# Patient Record
Sex: Male | Born: 1945 | Race: White | Hispanic: No | Marital: Married | Smoking: Never smoker
Health system: Southern US, Community
[De-identification: ages and names within clinical notes are randomized; demographics above are authoritative.]

## PROBLEM LIST (undated history)

## (undated) DIAGNOSIS — E785 Hyperlipidemia, unspecified: Secondary | ICD-10-CM

## (undated) DIAGNOSIS — I2510 Atherosclerotic heart disease of native coronary artery without angina pectoris: Secondary | ICD-10-CM

## (undated) DIAGNOSIS — K219 Gastro-esophageal reflux disease without esophagitis: Secondary | ICD-10-CM

## (undated) DIAGNOSIS — G4730 Sleep apnea, unspecified: Secondary | ICD-10-CM

## (undated) DIAGNOSIS — Z8719 Personal history of other diseases of the digestive system: Secondary | ICD-10-CM

## (undated) DIAGNOSIS — M1990 Unspecified osteoarthritis, unspecified site: Secondary | ICD-10-CM

## (undated) HISTORY — PX: KNEE SURGERY: SHX244

## (undated) HISTORY — DX: Atherosclerotic heart disease of native coronary artery without angina pectoris: I25.10

## (undated) HISTORY — DX: Hyperlipidemia, unspecified: E78.5

## (undated) HISTORY — PX: REFRACTIVE SURGERY: SHX103

## (undated) HISTORY — PX: CARDIAC CATHETERIZATION: SHX172

---

## 1997-10-01 ENCOUNTER — Other Ambulatory Visit: Admission: RE | Admit: 1997-10-01 | Discharge: 1997-10-01 | Payer: Self-pay | Admitting: *Deleted

## 2000-09-12 ENCOUNTER — Encounter: Admission: RE | Admit: 2000-09-12 | Discharge: 2000-09-12 | Payer: Self-pay | Admitting: *Deleted

## 2001-12-22 ENCOUNTER — Ambulatory Visit (HOSPITAL_COMMUNITY): Admission: RE | Admit: 2001-12-22 | Discharge: 2001-12-22 | Payer: Self-pay | Admitting: *Deleted

## 2004-11-05 HISTORY — PX: OTHER SURGICAL HISTORY: SHX169

## 2006-10-24 HISTORY — PX: OTHER SURGICAL HISTORY: SHX169

## 2010-08-13 ENCOUNTER — Other Ambulatory Visit: Payer: Self-pay | Admitting: Gastroenterology

## 2012-01-06 ENCOUNTER — Other Ambulatory Visit: Payer: Self-pay | Admitting: Gastroenterology

## 2012-11-30 ENCOUNTER — Encounter: Payer: Self-pay | Admitting: Physician Assistant

## 2012-11-30 DIAGNOSIS — I2510 Atherosclerotic heart disease of native coronary artery without angina pectoris: Secondary | ICD-10-CM | POA: Insufficient documentation

## 2012-11-30 DIAGNOSIS — E785 Hyperlipidemia, unspecified: Secondary | ICD-10-CM | POA: Insufficient documentation

## 2012-12-07 ENCOUNTER — Encounter: Payer: Self-pay | Admitting: Cardiovascular Disease

## 2012-12-07 ENCOUNTER — Ambulatory Visit (INDEPENDENT_AMBULATORY_CARE_PROVIDER_SITE_OTHER): Payer: Medicare Other | Admitting: Cardiovascular Disease

## 2012-12-07 VITALS — BP 120/62 | HR 53 | Ht 69.0 in | Wt 168.0 lb

## 2012-12-07 DIAGNOSIS — I2510 Atherosclerotic heart disease of native coronary artery without angina pectoris: Secondary | ICD-10-CM

## 2012-12-07 DIAGNOSIS — E785 Hyperlipidemia, unspecified: Secondary | ICD-10-CM

## 2012-12-07 MED ORDER — METOPROLOL TARTRATE 25 MG PO TABS: ORAL_TABLET | ORAL | Status: DC

## 2012-12-07 NOTE — Patient Instructions (Addendum)
Your physician wants you to follow-up in: 1 year with Dr Berry. You will receive a reminder letter in the mail two months in advance. If you don't receive a letter, please call our office to schedule the follow-up appointment.  

## 2012-12-07 NOTE — Assessment & Plan Note (Signed)
On statin therapy followed by his PCP 

## 2012-12-07 NOTE — Progress Notes (Signed)
12/07/2012 Clifford Sheppard   1945-04-27  [id number]  Primary Physician Mickie Hillier, MD Primary Cardiologist: Runell Gess MD Roseanne Reno   HPI:  The patient is a very pleasant 67 year old, thin and fit appearing, married, Caucasian male, father of three, grandfather to three grandchildren, who I saw a year ago. I catheterized him in May 1998 revealing a total RCA of left to right collaterals in the circumflex. The circumflex did have a 75% mid stenosis. I elected to treat him medically at that time. 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 It is completely asymptomatic. His lipid profile followed by his PCP. Since I saw him back in the office one year ago he denies chest pain or shortness of breath.     Current Outpatient Prescriptions  Medication Sig Dispense Refill  . aspirin 325 MG tablet Take 325 mg by mouth daily.      Marland Kitchen atorvastatin (LIPITOR) 80 MG tablet Take 80 mg by mouth daily.      . fish oil-omega-3 fatty acids 1000 MG capsule Take 1 g by mouth daily.      . metoprolol tartrate (LOPRESSOR) 25 MG tablet 1/2 tablet twice a day  90 tablet  3  . Multiple Vitamin (MULTIVITAMIN) capsule Take 1 capsule by mouth daily.       No current facility-administered medications for this visit.    Not on File  History   Social History  . Marital Status: Married    Spouse Name: N/A    Number of Children: N/A  . Years of Education: N/A   Occupational History  . Not on file.   Social History Main Topics  . Smoking status: Never Smoker   . Smokeless tobacco: Not on file  . Alcohol Use: Not on file  . Drug Use: Not on file  . Sexual Activity: Not on file   Other Topics Concern  . Not on file   Social History Narrative  . No narrative on file     Review of Systems: General: negative for chills, fever, night sweats or weight changes.  Cardiovascular: negative for chest pain,  dyspnea on exertion, edema, orthopnea, palpitations, paroxysmal nocturnal dyspnea or shortness of breath Dermatological: negative for rash Respiratory: negative for cough or wheezing Urologic: negative for hematuria Abdominal: negative for nausea, vomiting, diarrhea, bright red blood per rectum, melena, or hematemesis Neurologic: negative for visual changes, syncope, or dizziness All other systems reviewed and are otherwise negative except as noted above.    Blood pressure 120/62, pulse 53, height 5\' 9"  (1.753 m), weight 168 lb (76.204 kg).  General appearance: alert and no distress Neck: no adenopathy, no carotid bruit, no JVD, supple, symmetrical, trachea midline and thyroid not enlarged, symmetric, no tenderness/mass/nodules Lungs: clear to auscultation bilaterally Heart: regular rate and rhythm, S1, S2 normal, no murmur, click, rub or gallop Extremities: extremities normal, atraumatic, no cyanosis or edema  EKG sinus bradycardia at 53 without ST or T wave changes  ASSESSMENT AND PLAN:   Coronary artery disease History of cardiac catheterization May 1998 revealing a total RCA with left to right collaterals B. The circumflex which itself had a 75% mid stenosis. I elected to treat him medically at that time. His last Myoview performed 10/24/06 was notable for mild inferobasal ischemia probably related to collateral insufficiency though he is very active and asymptomatic.  HLD (hyperlipidemia) On statin therapy followed by his PCP  Runell Gess MD FACP,FACC,FAHA, [facility] 12/07/2012 11:23 AM

## 2012-12-07 NOTE — Assessment & Plan Note (Signed)
History of cardiac catheterization May 1998 revealing a total RCA with left to right collaterals B. The circumflex which itself had a 75% mid stenosis. I elected to treat him medically at that time. His last Myoview performed 10/24/06 was notable for mild inferobasal ischemia probably related to collateral insufficiency though he is very active and asymptomatic.

## 2013-04-09 ENCOUNTER — Other Ambulatory Visit: Payer: Self-pay | Admitting: *Deleted

## 2013-04-09 MED ORDER — METOPROLOL TARTRATE 25 MG PO TABS: ORAL_TABLET | ORAL | Status: DC

## 2013-04-16 ENCOUNTER — Telehealth: Payer: Self-pay | Admitting: Cardiovascular Disease

## 2013-04-16 MED ORDER — METOPROLOL TARTRATE 25 MG PO TABS: ORAL_TABLET | ORAL | Status: DC

## 2013-04-16 NOTE — Telephone Encounter (Signed)
Returned call and pt verified x 2.  Pt informed message received and refill will be sent for 6 more mos of refills.  Pt verbalized understanding and agreed w/ plan.  Refill(s) sent to pharmacy.

## 2013-04-16 NOTE — Telephone Encounter (Signed)
Got refill on metoprolol but no refills written.  Just changed over to [employer] and wants to get corrected.  Does not have upcoming appt until September.  Please call

## 2013-06-06 ENCOUNTER — Other Ambulatory Visit: Payer: Self-pay | Admitting: Cardiovascular Disease

## 2013-06-06 NOTE — Telephone Encounter (Signed)
Rx was sent to pharmacy electronically. 

## 2013-06-28 ENCOUNTER — Other Ambulatory Visit: Payer: Self-pay | Admitting: Family Medicine

## 2013-06-28 DIAGNOSIS — R109 Unspecified abdominal pain: Secondary | ICD-10-CM

## 2013-07-06 ENCOUNTER — Ambulatory Visit
Admission: RE | Admit: 2013-07-06 | Discharge: 2013-07-06 | Disposition: A | Discharge status: Home / Self Care | Payer: Commercial Managed Care - HMO | Source: Ambulatory Visit | Attending: Family Medicine | Admitting: Family Medicine

## 2013-07-06 DIAGNOSIS — R109 Unspecified abdominal pain: Secondary | ICD-10-CM

## 2013-07-06 MED ORDER — IOHEXOL 300 MG/ML  SOLN
100.0000 mL | Freq: Once | INTRAMUSCULAR | Status: AC | PRN
  Administered 2013-07-06: 100 mL via INTRAVENOUS

## 2013-07-25 ENCOUNTER — Ambulatory Visit
Admission: RE | Admit: 2013-07-25 | Discharge: 2013-07-25 | Disposition: A | Discharge status: Home / Self Care | Payer: Commercial Managed Care - HMO | Source: Ambulatory Visit | Attending: Family Medicine | Admitting: Family Medicine

## 2013-07-25 ENCOUNTER — Other Ambulatory Visit: Payer: Self-pay | Admitting: Family Medicine

## 2013-07-25 DIAGNOSIS — M545 Low back pain, unspecified: Secondary | ICD-10-CM

## 2013-10-24 ENCOUNTER — Other Ambulatory Visit: Payer: Self-pay | Admitting: Cardiovascular Disease

## 2013-10-24 NOTE — Telephone Encounter (Signed)
Rx refill sent to patient pharmacy   

## 2013-12-11 ENCOUNTER — Encounter: Payer: Self-pay | Admitting: Cardiovascular Disease

## 2013-12-11 ENCOUNTER — Ambulatory Visit (INDEPENDENT_AMBULATORY_CARE_PROVIDER_SITE_OTHER): Payer: Commercial Managed Care - HMO | Admitting: Cardiovascular Disease

## 2013-12-11 VITALS — BP 110/60 | HR 70 | Ht 69.0 in | Wt 168.5 lb

## 2013-12-11 DIAGNOSIS — Z01818 Encounter for other preprocedural examination: Secondary | ICD-10-CM

## 2013-12-11 DIAGNOSIS — E785 Hyperlipidemia, unspecified: Secondary | ICD-10-CM

## 2013-12-11 DIAGNOSIS — I2510 Atherosclerotic heart disease of native coronary artery without angina pectoris: Secondary | ICD-10-CM

## 2013-12-11 NOTE — Patient Instructions (Signed)
  We will see you back in follow up in 1 year with Dr Berry.   Dr Berry has ordered: Lexiscan Myoview- this is a test that looks at the blood flow to your heart muscle.  It takes approximately 2 1/2 hours. Please follow instruction sheet, as given.      

## 2013-12-11 NOTE — Assessment & Plan Note (Signed)
On statin therapy followed by his PCP 

## 2013-12-11 NOTE — Assessment & Plan Note (Signed)
History of CAD status post cardiac catheterization by myself 08/10/96 revealing an occluded dominant RCA, 75% segmental mid circumflex and a percent ramus branch stenosis with normal LV function. His last Myoview performed 10/24/06 was nonischemic. He denies chest pain or shortness of breath. He is going to get a rectal hip replacement in November and it's been 7 years since his last functional study which I'm going to repeat to risk stratify him.

## 2013-12-11 NOTE — Progress Notes (Signed)
12/11/2013 Clifford Sheppard   1945-06-18  [id number]  Primary Physician Gennette Pac, MD Primary Cardiologist: Lorretta Harp MD Renae Gloss   HPI:  The patient is a very pleasant 68 year old, thin and fit appearing, married, Caucasian male, father of three, grandfather to three grandchildren, who I saw a year ago. I catheterized him in May 1998 revealing a total RCA of left to right collaterals in the circumflex. The circumflex did have a 75% mid stenosis. I elected to treat him medically at that time. 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 It is completely asymptomatic. His lipid profile followed by his PCP. Since I saw him back in the office one year ago he denies chest pain or shortness of breath.    Current Outpatient Prescriptions  Medication Sig Dispense Refill  . ALEVE 220 MG tablet Take 1 tablet by mouth as needed.      Marland Kitchen aspirin 325 MG tablet Take 325 mg by mouth daily.      Marland Kitchen atorvastatin (LIPITOR) 80 MG tablet Take 80 mg by mouth daily.      . fish oil-omega-3 fatty acids 1000 MG capsule Take 1 g by mouth daily.      . meloxicam (MOBIC) 15 MG tablet Take 1 tablet by mouth as needed.      . metoprolol tartrate (LOPRESSOR) 25 MG tablet TAKE 1/2 TABLET TWICE DAILY  90 tablet  0  . Multiple Vitamin (MULTIVITAMIN) capsule Take 1 capsule by mouth daily.      Marland Kitchen REDNESS RELIEVER EYE DROPS 0.05 % ophthalmic solution Place 1 drop into both eyes as needed.      . sodium chloride (OCEAN) 0.65 % nasal spray Place 1 spray into the nose as needed.       No current facility-administered medications for this visit.    No Known Allergies  History   Social History  . Marital Status: Married    Spouse Name: N/A    Number of Children: N/A  . Years of Education: N/A   Occupational History  . Not on file.   Social History Main Topics  . Smoking status: Never Smoker   . Smokeless  tobacco: Not on file  . Alcohol Use: Not on file  . Drug Use: Not on file  . Sexual Activity: Not on file   Other Topics Concern  . Not on file   Social History Narrative  . No narrative on file     Review of Systems: General: negative for chills, fever, night sweats or weight changes.  Cardiovascular: negative for chest pain, dyspnea on exertion, edema, orthopnea, palpitations, paroxysmal nocturnal dyspnea or shortness of breath Dermatological: negative for rash Respiratory: negative for cough or wheezing Urologic: negative for hematuria Abdominal: negative for nausea, vomiting, diarrhea, bright red blood per rectum, melena, or hematemesis Neurologic: negative for visual changes, syncope, or dizziness All other systems reviewed and are otherwise negative except as noted above.    Blood pressure 110/60, pulse 70, height 5\' 9"  (1.753 m), weight 168 lb 8 oz (76.431 kg).  General appearance: alert and no distress Neck: no adenopathy, no carotid bruit, no JVD, supple, symmetrical, trachea midline and thyroid not enlarged, symmetric, no tenderness/mass/nodules Lungs: clear to auscultation bilaterally Heart: regular rate and rhythm, S1, S2 normal, no murmur, click, rub or gallop Extremities: extremities normal, atraumatic, no cyanosis or edema  EKG normal sinus rhythm at 70 without ST or T wave  changes  ASSESSMENT AND PLAN:   Coronary artery disease History of CAD status post cardiac catheterization by myself 08/10/96 revealing an occluded dominant RCA, 75% segmental mid circumflex and a percent ramus branch stenosis with normal LV function. His last Myoview performed 10/24/06 was nonischemic. He denies chest pain or shortness of breath. He is going to get a rectal hip replacement in November and it's been 7 years since his last functional study which I'm going to repeat to risk stratify him.  HLD (hyperlipidemia) On statin therapy followed by his PCP      Lorretta Harp MD  [facility], [facility] 12/11/2013 3:47 PM

## 2013-12-17 ENCOUNTER — Telehealth (HOSPITAL_COMMUNITY): Payer: Self-pay | Admitting: *Deleted

## 2013-12-19 ENCOUNTER — Telehealth (HOSPITAL_COMMUNITY): Payer: Self-pay

## 2013-12-19 NOTE — Telephone Encounter (Signed)
Encounter complete. 

## 2013-12-20 ENCOUNTER — Telehealth (HOSPITAL_COMMUNITY): Payer: Self-pay

## 2013-12-20 NOTE — Telephone Encounter (Signed)
Encounter complete. 

## 2013-12-21 ENCOUNTER — Other Ambulatory Visit: Payer: Self-pay | Admitting: Cardiovascular Disease

## 2013-12-21 ENCOUNTER — Ambulatory Visit (HOSPITAL_COMMUNITY)
Admission: RE | Admit: 2013-12-21 | Discharge: 2013-12-21 | Disposition: A | Discharge status: Home / Self Care | Payer: Medicare HMO | Source: Ambulatory Visit | Attending: Cardiology | Admitting: Cardiology

## 2013-12-21 DIAGNOSIS — I2510 Atherosclerotic heart disease of native coronary artery without angina pectoris: Secondary | ICD-10-CM | POA: Diagnosis not present

## 2013-12-21 DIAGNOSIS — Z01818 Encounter for other preprocedural examination: Secondary | ICD-10-CM | POA: Insufficient documentation

## 2013-12-21 DIAGNOSIS — Z01810 Encounter for preprocedural cardiovascular examination: Secondary | ICD-10-CM

## 2013-12-21 MED ORDER — TECHNETIUM TC 99M SESTAMIBI GENERIC - CARDIOLITE
30.0000 | Freq: Once | INTRAVENOUS | Status: AC | PRN
  Administered 2013-12-21: 30 via INTRAVENOUS

## 2013-12-21 MED ORDER — TECHNETIUM TC 99M SESTAMIBI GENERIC - CARDIOLITE
10.0000 | Freq: Once | INTRAVENOUS | Status: AC | PRN
  Administered 2013-12-21: 10 via INTRAVENOUS

## 2013-12-21 NOTE — Procedures (Addendum)
[facility] [address] [place] [address] [place] [address] [phone]  Cardiology Nuclear Med Study  Clifford Sheppard is a 68 y.o. male     MRN : [id number]     DOB: 08/26/1945  Procedure Date: 12/21/2013  Nuclear Med Background Indication for Stress Test:  Surgical Clearance History:  CAD;Last NUC MPI on 10/24/2006-ischemia;EF=74% Cardiac Risk Factors: Lipids  Symptoms:  Pt denies symptoms.   Nuclear Pre-Procedure Caffeine/Decaff Intake:  11:00pm NPO After: 9:00am   IV Site: R Hand  IV 0.9% NS with Angio Cath:  22g  Chest Size (in):  44"  IV Started by: Rolene Course, RN  Height: 5\' 9"  (1.753 m)  Cup Size: n/a  BMI:  Body mass index is 24.8 kg/(m^2). Weight:  168 lb (76.204 kg)   Tech Comments:  n/a    Nuclear Med Study 1 or 2 day study: 1 day  Stress Test Type:  Stress  Order Authorizing Provider:  Quay Burow, MD   Resting Radionuclide: Technetium 62m Sestamibi  Resting Radionuclide Dose: 10.8 mCi   Stress Radionuclide:  Technetium 60m Sestamibi  Stress Radionuclide Dose: 31.0 mCi           Stress Protocol Rest HR:59 Stress HR: 137  Rest BP: 143/79 Stress BP: 210/98  Exercise Time (min): 11:00 METS: 13.30   Predicted Max HR: 152 bpm % Max HR: 90.13 bpm Rate Pressure Product: 28770  Dose of Adenosine (mg):  n/a Dose of Lexiscan: n/a mg  Dose of Atropine (mg): n/a Dose of Dobutamine: n/a mcg/kg/min (at max HR)  Stress Test Technologist: Mellody Memos, CCT Nuclear Technologist: Otho Perl, CNMT   Rest Procedure:  Myocardial perfusion imaging was performed at rest 45 minutes following the intravenous administration of Technetium 74m Sestamibi. Stress Procedure:  The patient performed treadmill exercise using a Bruce  Protocol for 11 minutes. The patient stopped due to generalized fatigue. Patient denied any chest pain.  There were significant ST-T wave changes.  Technetium 24m Sestamibi was injected at peak exercise  and myocardial perfusion imaging was performed after a brief delay.  Transient Ischemic Dilatation (Normal <1.22):  1.06 QGS EDV:  83 ml QGS ESV:  33 ml LV Ejection Fraction: 60%        Rest ECG: NSR - Normal EKG  Stress ECG: No significant change from baseline ECG  QPS Raw Data Images:  Normal; no motion artifact; normal heart/lung ratio. Stress Images:  There is decreased uptake in the inferior wall. Rest Images:  Normal homogeneous uptake in all areas of the myocardium. Subtraction (SDS):  These findings are consistent with ischemia.  Impression Exercise Capacity:  Excellent exercise capacity. BP Response:  Normal blood pressure response. Clinical Symptoms:  No significant symptoms noted. ECG Impression:  No significant ST segment change suggestive of ischemia. Comparison with Prior Nuclear Study: No significant change from previous study  Overall Impression:  Low risk stress nuclear study Inferobasal ischemia vs valve plane artifact unchanged from prior study 2008.  LV Wall Motion:  NL LV Function; NL Wall Motion   Lorretta Harp, MD  12/22/2013 11:40 AM

## 2013-12-24 ENCOUNTER — Encounter: Payer: Self-pay | Admitting: *Deleted

## 2013-12-24 NOTE — Telephone Encounter (Signed)
Rx was sent to pharmacy electronically. 

## 2013-12-27 ENCOUNTER — Telehealth: Payer: Self-pay | Admitting: Cardiovascular Disease

## 2013-12-27 NOTE — Telephone Encounter (Signed)
Please call,would like his stress test results from last Friday(12-21-13).

## 2013-12-27 NOTE — Telephone Encounter (Signed)
Spoke with pt, aware of myoview results. 

## 2014-01-24 ENCOUNTER — Other Ambulatory Visit: Payer: Self-pay | Admitting: Orthopedic Surgery

## 2014-01-28 ENCOUNTER — Encounter (HOSPITAL_COMMUNITY): Payer: Self-pay | Admitting: Pharmacy Technician

## 2014-01-30 ENCOUNTER — Ambulatory Visit (HOSPITAL_COMMUNITY)
Admission: RE | Admit: 2014-01-30 | Discharge: 2014-01-30 | Disposition: A | Discharge status: Home / Self Care | Payer: Medicare HMO | Source: Ambulatory Visit | Attending: Orthopedic Surgery | Admitting: Orthopedic Surgery

## 2014-01-30 ENCOUNTER — Encounter (HOSPITAL_COMMUNITY)
Admission: RE | Admit: 2014-01-30 | Discharge: 2014-01-30 | Disposition: A | Discharge status: Home / Self Care | Payer: Medicare HMO | Source: Ambulatory Visit | Attending: Orthopedic Surgery | Admitting: Orthopedic Surgery

## 2014-01-30 ENCOUNTER — Encounter (HOSPITAL_COMMUNITY): Payer: Self-pay

## 2014-01-30 DIAGNOSIS — Z01818 Encounter for other preprocedural examination: Secondary | ICD-10-CM

## 2014-01-30 HISTORY — DX: Personal history of other diseases of the digestive system: Z87.19

## 2014-01-30 HISTORY — DX: Unspecified osteoarthritis, unspecified site: M19.90

## 2014-01-30 HISTORY — DX: Gastro-esophageal reflux disease without esophagitis: K21.9

## 2014-01-30 LAB — URINALYSIS, ROUTINE W REFLEX MICROSCOPIC
Bilirubin Urine: NEGATIVE
Glucose, UA: NEGATIVE mg/dL
HGB URINE DIPSTICK: NEGATIVE
Ketones, ur: NEGATIVE mg/dL
Leukocytes, UA: NEGATIVE
Nitrite: NEGATIVE
PH: 6.5 (ref 5.0–8.0)
Protein, ur: NEGATIVE mg/dL
SPECIFIC GRAVITY, URINE: 1.019 (ref 1.005–1.030)
Urobilinogen, UA: 1 mg/dL (ref 0.0–1.0)

## 2014-01-30 LAB — COMPREHENSIVE METABOLIC PANEL
ALK PHOS: 77 U/L (ref 39–117)
ALT: 27 U/L (ref 0–53)
ANION GAP: 13 (ref 5–15)
AST: 27 U/L (ref 0–37)
Albumin: 4.1 g/dL (ref 3.5–5.2)
BILIRUBIN TOTAL: 0.6 mg/dL (ref 0.3–1.2)
BUN: 25 mg/dL — ABNORMAL HIGH (ref 6–23)
CO2: 28 meq/L (ref 19–32)
CREATININE: 1.11 mg/dL (ref 0.50–1.35)
Calcium: 9.2 mg/dL (ref 8.4–10.5)
Chloride: 100 mEq/L (ref 96–112)
GFR, EST AFRICAN AMERICAN: 77 mL/min — AB (ref >90)
GFR, EST NON AFRICAN AMERICAN: 66 mL/min — AB (ref >90)
GLUCOSE: 94 mg/dL (ref 70–99)
POTASSIUM: 4 meq/L (ref 3.7–5.3)
Sodium: 141 mEq/L (ref 137–147)
Total Protein: 7.2 g/dL (ref 6.0–8.3)

## 2014-01-30 LAB — ABO/RH: ABO/RH(D): A POS

## 2014-01-30 LAB — CBC WITH DIFFERENTIAL/PLATELET
Basophils Absolute: 0 10*3/uL (ref 0.0–0.1)
Basophils Relative: 0 % (ref 0–1)
Eosinophils Absolute: 0.2 10*3/uL (ref 0.0–0.7)
Eosinophils Relative: 3 % (ref 0–5)
HEMATOCRIT: 45.3 % (ref 39.0–52.0)
Hemoglobin: 16 g/dL (ref 13.0–17.0)
LYMPHS ABS: 1.9 10*3/uL (ref 0.7–4.0)
Lymphocytes Relative: 28 % (ref 12–46)
MCH: 32.5 pg (ref 26.0–34.0)
MCHC: 35.3 g/dL (ref 30.0–36.0)
MCV: 91.9 fL (ref 78.0–100.0)
MONO ABS: 0.4 10*3/uL (ref 0.1–1.0)
MONOS PCT: 6 % (ref 3–12)
NEUTROS ABS: 4.2 10*3/uL (ref 1.7–7.7)
NEUTROS PCT: 63 % (ref 43–77)
Platelets: 182 10*3/uL (ref 150–400)
RBC: 4.93 MIL/uL (ref 4.22–5.81)
RDW: 12.5 % (ref 11.5–15.5)
WBC: 6.7 10*3/uL (ref 4.0–10.5)

## 2014-01-30 LAB — PROTIME-INR
INR: 1.07 (ref 0.00–1.49)
Prothrombin Time: 14.1 seconds (ref 11.6–15.2)

## 2014-01-30 LAB — SURGICAL PCR SCREEN
MRSA, PCR: NEGATIVE
STAPHYLOCOCCUS AUREUS: POSITIVE — AB

## 2014-01-30 LAB — TYPE AND SCREEN
ABO/RH(D): A POS
ANTIBODY SCREEN: NEGATIVE

## 2014-01-30 LAB — APTT: APTT: 29 s (ref 24–37)

## 2014-01-30 NOTE — Pre-Procedure Instructions (Signed)
JARMAN LITTON  01/30/2014   Your procedure is scheduled on:   Friday  02/08/14  Report to [facility] Admitting at 530 AM.  Call this number if you have problems the morning of surgery: [phone]   Remember:   Do not eat food or drink liquids after midnight.   Take these medicines the morning of surgery with A SIP OF WATER:   METOPROLOL(LOPRESSOR), OMEPRAZOLE (PRILOSEC)                   STOP ASPIRIN, MELOXICAM(MOBIC), MULTIVITAMIN, OMEGA3 FATTY ACID (FISH OIL)   Do not wear jewelry, make-up or nail polish.  Do not wear lotions, powders, or perfumes. You may wear deodorant.  Do not shave 48 hours prior to surgery. Men may shave face and neck.  Do not bring valuables to the hospital.  [facility] is not responsible                  for any belongings or valuables.               Contacts, dentures or bridgework may not be worn into surgery.  Leave suitcase in the car. After surgery it may be brought to your room.  For patients admitted to the hospital, discharge time is determined by your                treatment team.               Patients discharged the day of surgery will not be allowed to drive  home.  Name and phone number of your driver:   Special Instructions:  Special Instructions:  - Preparing for Surgery  Before surgery, you can play an important role.  Because skin is not sterile, your skin needs to be as free of germs as possible.  You can reduce the number of germs on you skin by washing with CHG (chlorahexidine gluconate) soap before surgery.  CHG is an antiseptic cleaner which kills germs and bonds with the skin to continue killing germs even after washing.  Please DO NOT use if you have an allergy to CHG or antibacterial soaps.  If your skin becomes reddened/irritated stop using the CHG and inform your nurse when you arrive at Short Stay.  Do not shave (including legs and underarms) for at least 48 hours prior to the first CHG shower.  You may  shave your face.  Please follow these instructions carefully:   1.  Shower with CHG Soap the night before surgery and the morning of Surgery.  2.  If you choose to wash your hair, wash your hair first as usual with your normal shampoo.  3.  After you shampoo, rinse your hair and body thoroughly to remove the Shampoo.  4.  Use CHG as you would any other liquid soap. You can apply chg directly to the skin and wash gently with scrungie or a clean washcloth.  5.  Apply the CHG Soap to your body ONLY FROM THE NECK DOWN.  Do not use on open wounds or open sores.  Avoid contact with your eyes, ears, mouth and genitals (private parts).  Wash genitals (private parts with your normal soap.  6.  Wash thoroughly, paying special attention to the area where your surgery will be performed.  7.  Thoroughly rinse your body with warm water from the neck down.  8.  DO NOT shower/wash with your normal soap after using and rinsing off  the CHG Soap.  9.  Pat yourself dry with a clean towel.            10.  Wear clean pajamas.            11.  Place clean sheets on your bed the night of your first shower and do not sleep with pets.  Day of Surgery  Do not apply any lotions/deodorants the morning of surgery.  Please wear clean clothes to the hospital/surgery center.   Please read over the following fact sheets that you were given: Pain Booklet, Coughing and Deep Breathing, Blood Transfusion Information, MRSA Information and Surgical Site Infection Prevention

## 2014-01-31 NOTE — Progress Notes (Signed)
Mupirocin Ointment Rx called into [facility] on [address] at 7:15 AM today. Notified pt via phone that PCR was positive for staph. Pt voiced understanding.

## 2014-02-07 MED ORDER — CEFAZOLIN SODIUM-DEXTROSE 2-3 GM-% IV SOLR
2.0000 g | INTRAVENOUS | Status: AC
  Administered 2014-02-08: 2 g via INTRAVENOUS
  Filled 2014-02-07: qty 50

## 2014-02-08 ENCOUNTER — Inpatient Hospital Stay (HOSPITAL_COMMUNITY): Payer: Medicare HMO

## 2014-02-08 ENCOUNTER — Inpatient Hospital Stay (HOSPITAL_COMMUNITY): Payer: Medicare HMO | Admitting: Anesthesiology

## 2014-02-08 ENCOUNTER — Encounter (HOSPITAL_COMMUNITY)
Admission: RE | Disposition: A | Discharge status: Home Health | Payer: Self-pay | Source: Ambulatory Visit | Attending: Orthopedic Surgery

## 2014-02-08 ENCOUNTER — Encounter (HOSPITAL_COMMUNITY): Payer: Self-pay | Admitting: *Deleted

## 2014-02-08 ENCOUNTER — Inpatient Hospital Stay (HOSPITAL_COMMUNITY)
Admission: RE | Admit: 2014-02-08 | Discharge: 2014-02-09 | DRG: 470 | Disposition: A | Discharge status: Home Health | Payer: Medicare HMO | Source: Ambulatory Visit | Attending: Orthopedic Surgery | Admitting: Orthopedic Surgery

## 2014-02-08 DIAGNOSIS — K219 Gastro-esophageal reflux disease without esophagitis: Secondary | ICD-10-CM | POA: Diagnosis present

## 2014-02-08 DIAGNOSIS — I10 Essential (primary) hypertension: Secondary | ICD-10-CM | POA: Diagnosis present

## 2014-02-08 DIAGNOSIS — Z79899 Other long term (current) drug therapy: Secondary | ICD-10-CM | POA: Diagnosis not present

## 2014-02-08 DIAGNOSIS — I2510 Atherosclerotic heart disease of native coronary artery without angina pectoris: Secondary | ICD-10-CM | POA: Diagnosis present

## 2014-02-08 DIAGNOSIS — M25551 Pain in right hip: Secondary | ICD-10-CM | POA: Diagnosis present

## 2014-02-08 DIAGNOSIS — M1611 Unilateral primary osteoarthritis, right hip: Principal | ICD-10-CM | POA: Diagnosis present

## 2014-02-08 DIAGNOSIS — E785 Hyperlipidemia, unspecified: Secondary | ICD-10-CM | POA: Diagnosis present

## 2014-02-08 DIAGNOSIS — Z7982 Long term (current) use of aspirin: Secondary | ICD-10-CM | POA: Diagnosis not present

## 2014-02-08 DIAGNOSIS — Z419 Encounter for procedure for purposes other than remedying health state, unspecified: Secondary | ICD-10-CM

## 2014-02-08 HISTORY — PX: TOTAL HIP ARTHROPLASTY: SHX124

## 2014-02-08 SURGERY — ARTHROPLASTY, HIP, TOTAL, ANTERIOR APPROACH
Anesthesia: General | Site: Hip | Laterality: Right

## 2014-02-08 MED ORDER — GLYCOPYRROLATE 0.2 MG/ML IJ SOLN
INTRAMUSCULAR | Status: AC
  Filled 2014-02-08: qty 4

## 2014-02-08 MED ORDER — ONDANSETRON HCL 4 MG/2ML IJ SOLN
INTRAMUSCULAR | Status: DC | PRN
  Administered 2014-02-08: 4 mg via INTRAVENOUS

## 2014-02-08 MED ORDER — NEOSTIGMINE METHYLSULFATE 10 MG/10ML IV SOLN
INTRAVENOUS | Status: DC | PRN
Start: 2014-02-08 — End: 2014-02-08
  Administered 2014-02-08: 4 mg via INTRAVENOUS

## 2014-02-08 MED ORDER — ONDANSETRON HCL 4 MG PO TABS: 4.0000 mg | ORAL_TABLET | Freq: Four times a day (QID) | ORAL | Status: DC | PRN

## 2014-02-08 MED ORDER — DOCUSATE SODIUM 100 MG PO CAPS
100.0000 mg | ORAL_CAPSULE | Freq: Two times a day (BID) | ORAL | Status: DC
  Administered 2014-02-08 – 2014-02-09 (×2): 100 mg via ORAL
  Filled 2014-02-08 (×3): qty 1

## 2014-02-08 MED ORDER — ROCURONIUM BROMIDE 50 MG/5ML IV SOLN
INTRAVENOUS | Status: AC
  Filled 2014-02-08: qty 1

## 2014-02-08 MED ORDER — MIDAZOLAM HCL 5 MG/5ML IJ SOLN
INTRAMUSCULAR | Status: DC | PRN
  Administered 2014-02-08: 2 mg via INTRAVENOUS

## 2014-02-08 MED ORDER — NEOSTIGMINE METHYLSULFATE 10 MG/10ML IV SOLN
INTRAVENOUS | Status: AC
  Filled 2014-02-08: qty 1

## 2014-02-08 MED ORDER — MIDAZOLAM HCL 2 MG/2ML IJ SOLN
INTRAMUSCULAR | Status: AC
  Filled 2014-02-08: qty 2

## 2014-02-08 MED ORDER — BSS IO SOLN
15.0000 mL | Freq: Once | INTRAOCULAR | Status: AC
  Administered 2014-02-08: 15 mL
  Filled 2014-02-08: qty 15

## 2014-02-08 MED ORDER — FENTANYL CITRATE 0.05 MG/ML IJ SOLN
INTRAMUSCULAR | Status: AC
  Filled 2014-02-08: qty 5

## 2014-02-08 MED ORDER — ASPIRIN EC 325 MG PO TBEC: 325.0000 mg | DELAYED_RELEASE_TABLET | Freq: Two times a day (BID) | ORAL | Status: DC

## 2014-02-08 MED ORDER — METHOCARBAMOL 500 MG PO TABS
500.0000 mg | ORAL_TABLET | Freq: Four times a day (QID) | ORAL | Status: DC | PRN
  Administered 2014-02-08: 500 mg via ORAL
  Filled 2014-02-08 (×2): qty 1

## 2014-02-08 MED ORDER — ALBUMIN HUMAN 5 % IV SOLN
INTRAVENOUS | Status: DC | PRN
  Administered 2014-02-08: 09:00:00 via INTRAVENOUS

## 2014-02-08 MED ORDER — HYDROMORPHONE HCL 1 MG/ML IJ SOLN: 1.0000 mg | INTRAMUSCULAR | Status: DC | PRN

## 2014-02-08 MED ORDER — GLYCOPYRROLATE 0.2 MG/ML IJ SOLN
INTRAMUSCULAR | Status: DC | PRN
  Administered 2014-02-08: .7 mg via INTRAVENOUS

## 2014-02-08 MED ORDER — TRANEXAMIC ACID 100 MG/ML IV SOLN
2000.0000 mg | Freq: Once | INTRAVENOUS | Status: AC
  Administered 2014-02-08: 2000 mg via TOPICAL
  Filled 2014-02-08: qty 20

## 2014-02-08 MED ORDER — METHOCARBAMOL 750 MG PO TABS: 750.0000 mg | ORAL_TABLET | Freq: Three times a day (TID) | ORAL | Status: DC | PRN

## 2014-02-08 MED ORDER — PROPOFOL 10 MG/ML IV BOLUS
INTRAVENOUS | Status: DC | PRN
  Administered 2014-02-08: 100 mg via INTRAVENOUS

## 2014-02-08 MED ORDER — BUPIVACAINE HCL 0.5 % IJ SOLN
INTRAMUSCULAR | Status: DC | PRN
  Administered 2014-02-08: 20 mL

## 2014-02-08 MED ORDER — BUPIVACAINE LIPOSOME 1.3 % IJ SUSP
INTRAMUSCULAR | Status: DC | PRN
  Administered 2014-02-08: 20 mL

## 2014-02-08 MED ORDER — INFLUENZA VAC SPLIT QUAD 0.5 ML IM SUSY
0.5000 mL | PREFILLED_SYRINGE | INTRAMUSCULAR | Status: DC
  Filled 2014-02-08: qty 0.5

## 2014-02-08 MED ORDER — 0.9 % SODIUM CHLORIDE (POUR BTL) OPTIME
TOPICAL | Status: DC | PRN
  Administered 2014-02-08: 1000 mL

## 2014-02-08 MED ORDER — ARTIFICIAL TEARS OP OINT
TOPICAL_OINTMENT | OPHTHALMIC | Status: AC
  Filled 2014-02-08: qty 3.5

## 2014-02-08 MED ORDER — CEFAZOLIN SODIUM-DEXTROSE 2-3 GM-% IV SOLR
2.0000 g | Freq: Four times a day (QID) | INTRAVENOUS | Status: AC
  Administered 2014-02-08 (×2): 2 g via INTRAVENOUS
  Filled 2014-02-08 (×2): qty 50

## 2014-02-08 MED ORDER — KETOROLAC TROMETHAMINE 15 MG/ML IJ SOLN
15.0000 mg | Freq: Four times a day (QID) | INTRAMUSCULAR | Status: AC
  Administered 2014-02-08 – 2014-02-09 (×4): 15 mg via INTRAVENOUS
  Filled 2014-02-08 (×3): qty 1

## 2014-02-08 MED ORDER — KETOROLAC TROMETHAMINE 0.5 % OP SOLN
1.0000 [drp] | Freq: Three times a day (TID) | OPHTHALMIC | Status: DC | PRN
  Administered 2014-02-08 – 2014-02-09 (×3): 1 [drp] via OPHTHALMIC
  Filled 2014-02-08 (×2): qty 3

## 2014-02-08 MED ORDER — PANTOPRAZOLE SODIUM 40 MG PO TBEC
40.0000 mg | DELAYED_RELEASE_TABLET | Freq: Every day | ORAL | Status: DC
Start: 2014-02-08 — End: 2014-02-09
  Administered 2014-02-08 – 2014-02-09 (×2): 40 mg via ORAL
  Filled 2014-02-08 (×2): qty 1

## 2014-02-08 MED ORDER — EPHEDRINE SULFATE 50 MG/ML IJ SOLN
INTRAMUSCULAR | Status: AC
  Filled 2014-02-08: qty 1

## 2014-02-08 MED ORDER — ASPIRIN EC 325 MG PO TBEC
325.0000 mg | DELAYED_RELEASE_TABLET | Freq: Two times a day (BID) | ORAL | Status: DC
  Administered 2014-02-08 – 2014-02-09 (×2): 325 mg via ORAL
  Filled 2014-02-08 (×4): qty 1

## 2014-02-08 MED ORDER — VECURONIUM BROMIDE 10 MG IV SOLR
INTRAVENOUS | Status: DC | PRN
  Administered 2014-02-08: 2 mg via INTRAVENOUS
  Administered 2014-02-08: 1 mg via INTRAVENOUS

## 2014-02-08 MED ORDER — ROCURONIUM BROMIDE 100 MG/10ML IV SOLN
INTRAVENOUS | Status: DC | PRN
  Administered 2014-02-08: 50 mg via INTRAVENOUS

## 2014-02-08 MED ORDER — EPHEDRINE SULFATE 50 MG/ML IJ SOLN
INTRAMUSCULAR | Status: DC | PRN
  Administered 2014-02-08: 10 mg via INTRAVENOUS

## 2014-02-08 MED ORDER — PROPOFOL 10 MG/ML IV BOLUS
INTRAVENOUS | Status: AC
  Filled 2014-02-08: qty 20

## 2014-02-08 MED ORDER — SODIUM CHLORIDE 0.9 % IV SOLN
INTRAVENOUS | Status: DC
  Administered 2014-02-09: via INTRAVENOUS

## 2014-02-08 MED ORDER — PHENYLEPHRINE 40 MCG/ML (10ML) SYRINGE FOR IV PUSH (FOR BLOOD PRESSURE SUPPORT)
PREFILLED_SYRINGE | INTRAVENOUS | Status: AC
  Filled 2014-02-08: qty 10

## 2014-02-08 MED ORDER — BUPIVACAINE LIPOSOME 1.3 % IJ SUSP
20.0000 mL | INTRAMUSCULAR | Status: DC
  Filled 2014-02-08: qty 20

## 2014-02-08 MED ORDER — SODIUM CHLORIDE 0.9 % IJ SOLN
INTRAMUSCULAR | Status: AC
  Filled 2014-02-08: qty 10

## 2014-02-08 MED ORDER — DEXAMETHASONE SODIUM PHOSPHATE 10 MG/ML IJ SOLN
INTRAMUSCULAR | Status: DC | PRN
  Administered 2014-02-08: 10 mg via INTRAVENOUS

## 2014-02-08 MED ORDER — METOPROLOL TARTRATE 12.5 MG HALF TABLET
12.5000 mg | ORAL_TABLET | Freq: Two times a day (BID) | ORAL | Status: DC
Start: 2014-02-08 — End: 2014-02-09
  Administered 2014-02-08 – 2014-02-09 (×3): 12.5 mg via ORAL
  Filled 2014-02-08 (×4): qty 1

## 2014-02-08 MED ORDER — HYDROMORPHONE HCL 1 MG/ML IJ SOLN
INTRAMUSCULAR | Status: AC
  Filled 2014-02-08: qty 1

## 2014-02-08 MED ORDER — LACTATED RINGERS IV SOLN
INTRAVENOUS | Status: DC | PRN
  Administered 2014-02-08 (×3): via INTRAVENOUS

## 2014-02-08 MED ORDER — FENTANYL CITRATE 0.05 MG/ML IJ SOLN
INTRAMUSCULAR | Status: DC | PRN
  Administered 2014-02-08 (×3): 50 ug via INTRAVENOUS
  Administered 2014-02-08: 100 ug via INTRAVENOUS
  Administered 2014-02-08: 50 ug via INTRAVENOUS

## 2014-02-08 MED ORDER — ALUM & MAG HYDROXIDE-SIMETH 200-200-20 MG/5ML PO SUSP: 30.0000 mL | ORAL | Status: DC | PRN

## 2014-02-08 MED ORDER — ACETAMINOPHEN 650 MG RE SUPP: 650.0000 mg | Freq: Four times a day (QID) | RECTAL | Status: DC | PRN

## 2014-02-08 MED ORDER — OXYCODONE-ACETAMINOPHEN 5-325 MG PO TABS: 1.0000 | ORAL_TABLET | ORAL | Status: DC | PRN

## 2014-02-08 MED ORDER — CHLORHEXIDINE GLUCONATE 4 % EX LIQD
60.0000 mL | Freq: Once | CUTANEOUS | Status: DC
  Filled 2014-02-08: qty 60

## 2014-02-08 MED ORDER — LIDOCAINE HCL (CARDIAC) 20 MG/ML IV SOLN
INTRAVENOUS | Status: AC
  Filled 2014-02-08: qty 5

## 2014-02-08 MED ORDER — LIDOCAINE HCL (CARDIAC) 20 MG/ML IV SOLN
INTRAVENOUS | Status: DC | PRN
  Administered 2014-02-08: 100 mg via INTRAVENOUS

## 2014-02-08 MED ORDER — ACETAMINOPHEN 325 MG PO TABS: 650.0000 mg | ORAL_TABLET | Freq: Four times a day (QID) | ORAL | Status: DC | PRN

## 2014-02-08 MED ORDER — ONDANSETRON HCL 4 MG/2ML IJ SOLN
INTRAMUSCULAR | Status: AC
  Filled 2014-02-08: qty 2

## 2014-02-08 MED ORDER — ATORVASTATIN CALCIUM 80 MG PO TABS
80.0000 mg | ORAL_TABLET | Freq: Every day | ORAL | Status: DC
  Administered 2014-02-08 – 2014-02-09 (×2): 80 mg via ORAL
  Filled 2014-02-08 (×2): qty 1

## 2014-02-08 MED ORDER — SUCCINYLCHOLINE CHLORIDE 20 MG/ML IJ SOLN
INTRAMUSCULAR | Status: AC
  Filled 2014-02-08: qty 1

## 2014-02-08 MED ORDER — ONDANSETRON HCL 4 MG/2ML IJ SOLN: 4.0000 mg | Freq: Four times a day (QID) | INTRAMUSCULAR | Status: DC | PRN

## 2014-02-08 MED ORDER — HYDROMORPHONE HCL 1 MG/ML IJ SOLN
0.2500 mg | INTRAMUSCULAR | Status: DC | PRN
  Administered 2014-02-08 (×4): 0.5 mg via INTRAVENOUS

## 2014-02-08 MED ORDER — PROMETHAZINE HCL 25 MG/ML IJ SOLN: 12.5000 mg | Freq: Four times a day (QID) | INTRAMUSCULAR | Status: DC | PRN

## 2014-02-08 MED ORDER — DEXTROSE 5 % IV SOLN
500.0000 mg | Freq: Four times a day (QID) | INTRAVENOUS | Status: DC | PRN
  Filled 2014-02-08: qty 5

## 2014-02-08 MED ORDER — POLYETHYLENE GLYCOL 3350 17 G PO PACK
17.0000 g | PACK | Freq: Every day | ORAL | Status: DC | PRN
Start: 2014-02-08 — End: 2014-02-09

## 2014-02-08 MED ORDER — DEXAMETHASONE SODIUM PHOSPHATE 10 MG/ML IJ SOLN
INTRAMUSCULAR | Status: AC
  Filled 2014-02-08: qty 1

## 2014-02-08 MED ORDER — BISACODYL 5 MG PO TBEC: 5.0000 mg | DELAYED_RELEASE_TABLET | Freq: Every day | ORAL | Status: DC | PRN

## 2014-02-08 MED ORDER — DIPHENHYDRAMINE HCL 12.5 MG/5ML PO ELIX: 12.5000 mg | ORAL_SOLUTION | ORAL | Status: DC | PRN

## 2014-02-08 MED ORDER — METHOCARBAMOL 500 MG PO TABS
ORAL_TABLET | ORAL | Status: AC
  Filled 2014-02-08: qty 1

## 2014-02-08 SURGICAL SUPPLY — 60 items
BENZOIN TINCTURE PRP APPL 2/3 (GAUZE/BANDAGES/DRESSINGS) ×6 IMPLANT
BLADE SAW SGTL 18X1.27X75 (BLADE) ×2 IMPLANT
BLADE SAW SGTL 18X1.27X75MM (BLADE) ×1
BLADE SURG ROTATE 9660 (MISCELLANEOUS) ×3 IMPLANT
BNDG COHESIVE 6X5 TAN STRL LF (GAUZE/BANDAGES/DRESSINGS) IMPLANT
BNDG GAUZE ELAST 4 BULKY (GAUZE/BANDAGES/DRESSINGS) IMPLANT
CAPT HIP PF MOP ×3 IMPLANT
CELLS DAT CNTRL 66122 CELL SVR (MISCELLANEOUS) IMPLANT
CLOSURE STERI-STRIP 1/2X4 (GAUZE/BANDAGES/DRESSINGS)
CLSR STERI-STRIP ANTIMIC 1/2X4 (GAUZE/BANDAGES/DRESSINGS) IMPLANT
COVER SURGICAL LIGHT HANDLE (MISCELLANEOUS) ×3 IMPLANT
DRAPE C-ARM 42X72 X-RAY (DRAPES) ×3 IMPLANT
DRAPE IMP U-DRAPE 54X76 (DRAPES) ×3 IMPLANT
DRAPE INCISE IOBAN 66X45 STRL (DRAPES) ×3 IMPLANT
DRAPE STERI IOBAN 125X83 (DRAPES) ×3 IMPLANT
DRAPE U-SHAPE 47X51 STRL (DRAPES) ×9 IMPLANT
DRSG MEPILEX BORDER 4X4 (GAUZE/BANDAGES/DRESSINGS) ×3 IMPLANT
DRSG MEPILEX BORDER 4X8 (GAUZE/BANDAGES/DRESSINGS) IMPLANT
DURAPREP 26ML APPLICATOR (WOUND CARE) ×3 IMPLANT
ELECT BLADE 4.0 EZ CLEAN MEGAD (MISCELLANEOUS) ×3
ELECT CAUTERY BLADE 6.4 (BLADE) ×3 IMPLANT
ELECT REM PT RETURN 9FT ADLT (ELECTROSURGICAL) ×3
ELECTRODE BLDE 4.0 EZ CLN MEGD (MISCELLANEOUS) ×1 IMPLANT
ELECTRODE REM PT RTRN 9FT ADLT (ELECTROSURGICAL) ×1 IMPLANT
GAUZE XEROFORM 1X8 LF (GAUZE/BANDAGES/DRESSINGS) IMPLANT
GLOVE BIOGEL PI IND STRL 6.5 (GLOVE) ×1 IMPLANT
GLOVE BIOGEL PI IND STRL 7.0 (GLOVE) ×1 IMPLANT
GLOVE BIOGEL PI IND STRL 8 (GLOVE) ×2 IMPLANT
GLOVE BIOGEL PI INDICATOR 6.5 (GLOVE) ×2
GLOVE BIOGEL PI INDICATOR 7.0 (GLOVE) ×2
GLOVE BIOGEL PI INDICATOR 8 (GLOVE) ×4
GLOVE ECLIPSE 7.0 STRL STRAW (GLOVE) ×3 IMPLANT
GLOVE ECLIPSE 7.5 STRL STRAW (GLOVE) ×9 IMPLANT
GOWN STRL REUS W/ TWL LRG LVL3 (GOWN DISPOSABLE) ×2 IMPLANT
GOWN STRL REUS W/ TWL XL LVL3 (GOWN DISPOSABLE) ×2 IMPLANT
GOWN STRL REUS W/TWL LRG LVL3 (GOWN DISPOSABLE) ×4
GOWN STRL REUS W/TWL XL LVL3 (GOWN DISPOSABLE) ×4
HOOD PEEL AWAY FACE SHEILD DIS (HOOD) ×9 IMPLANT
KIT BASIN OR (CUSTOM PROCEDURE TRAY) ×3 IMPLANT
KIT ROOM TURNOVER OR (KITS) ×3 IMPLANT
MANIFOLD NEPTUNE II (INSTRUMENTS) ×3 IMPLANT
NS IRRIG 1000ML POUR BTL (IV SOLUTION) ×3 IMPLANT
PACK TOTAL JOINT (CUSTOM PROCEDURE TRAY) ×3 IMPLANT
PACK UNIVERSAL I (CUSTOM PROCEDURE TRAY) ×3 IMPLANT
PAD ARMBOARD 7.5X6 YLW CONV (MISCELLANEOUS) ×6 IMPLANT
PIN SECT CUP 56MM (Hips) ×3 IMPLANT
RETRACTOR WOUND ALEXIS SMALL ×3 IMPLANT
RTRCTR WOUND ALEXIS 18CM MED (MISCELLANEOUS)
SPONGE LAP 18X18 X RAY DECT (DISPOSABLE) IMPLANT
STAPLER VISISTAT 35W (STAPLE) IMPLANT
SUT ETHIBOND NAB CT1 #1 30IN (SUTURE) ×6 IMPLANT
SUT MNCRL AB 3-0 PS2 18 (SUTURE) IMPLANT
SUT VIC AB 0 CT1 27 (SUTURE) ×2
SUT VIC AB 0 CT1 27XBRD ANBCTR (SUTURE) ×1 IMPLANT
SUT VIC AB 1 CT1 27 (SUTURE) ×8
SUT VIC AB 1 CT1 27XBRD ANBCTR (SUTURE) ×4 IMPLANT
SUT VIC AB 2-0 CT1 27 (SUTURE) ×2
SUT VIC AB 2-0 CT1 TAPERPNT 27 (SUTURE) ×1 IMPLANT
TOWEL OR 17X24 6PK STRL BLUE (TOWEL DISPOSABLE) ×3 IMPLANT
TOWEL OR 17X26 10 PK STRL BLUE (TOWEL DISPOSABLE) ×3 IMPLANT

## 2014-02-08 NOTE — Evaluation (Signed)
Physical Therapy Evaluation Patient Details Name: Clifford Sheppard MRN: [id number] DOB: 19-Aug-1945 Today's Date: 02/08/2014   History of Present Illness  Clifford Sheppard is a 68 y.o. Male s/p Right THA direct anterior approach on 02/08/14. PMH of CAD, HLD, CAD, GERD, and arthritis.  Clinical Impression  Pt is s/p Rt THA resulting in the deficits listed below (see PT Problem List).  Pt will benefit from skilled PT to increase their independence and safety with mobility to allow discharge to the venue listed below. Anticipate D/C home tomorrow after address stair goal. May progress and not need RW will hold on DME recommendations at this time.      Follow Up Recommendations Outpatient PT;Supervision - Intermittent    Equipment Recommendations  Other (comment) (TBD; may not need RW )    Recommendations for Other Services       Precautions / Restrictions Precautions Precautions: Anterior Hip Precaution Comments: no precautions Restrictions Weight Bearing Restrictions: No RLE Weight Bearing: Weight bearing as tolerated      Mobility  Bed Mobility Overal bed mobility: Needs Assistance Bed Mobility: Supine to Sit     Supine to sit: Supervision;HOB elevated     General bed mobility comments: pt relying on handrails; cues for sequencing  Transfers Overall transfer level: Needs assistance Equipment used: Rolling walker (2 wheeled) Transfers: Sit to/from Stand Sit to Stand: Supervision         General transfer comment: cues for hand placement and safety with RW  Ambulation/Gait Ambulation/Gait assistance: Min guard Ambulation Distance (Feet): 175 Feet Assistive device: Rolling walker (2 wheeled) Gait Pattern/deviations: Step-to pattern;Decreased stride length;Shuffle Gait velocity: decreased Gait velocity interpretation: Below normal speed for age/gender General Gait Details: cues for step through gt pattern and equal strides; min guard to steady primarily with directional  changes and with RW  Stairs            Wheelchair Mobility    Modified Rankin (Stroke Patients Only)       Balance Overall balance assessment: Needs assistance Sitting-balance support: Feet supported;No upper extremity supported Sitting balance-Leahy Scale: Good Sitting balance - Comments: denied dizziness sat EOB ~3 min   Standing balance support: During functional activity;No upper extremity supported Standing balance-Leahy Scale: Fair Standing balance comment: stood brief periods without UE support                             Pertinent Vitals/Pain Pain Assessment: 0-10 Pain Score: 5  Pain Location: Rt hip  Pain Descriptors / Indicators: Sore ("stiff") Pain Intervention(s): Monitored during session;Premedicated before session;Repositioned;Ice applied    Home Living Family/patient expects to be discharged to:: Private residence Living Arrangements: Spouse/significant other Available Help at Discharge: Family;Available 24 hours/day Type of Home: House Home Access: Stairs to enter Entrance Stairs-Rails: Can reach both;Left;Right Entrance Stairs-Number of Steps: 2 Home Layout: One level Home Equipment: Shower seat - built in;Grab bars - tub/shower      Prior Function Level of Independence: Independent               Hand Dominance        Extremity/Trunk Assessment   Upper Extremity Assessment: Defer to OT evaluation           Lower Extremity Assessment: RLE deficits/detail RLE Deficits / Details: quad 3+/5; hip 3/5    Cervical / Trunk Assessment: Normal  Communication   Communication: No difficulties  Cognition Arousal/Alertness: Awake/alert Behavior During Therapy: WFL for tasks  assessed/performed Overall Cognitive Status: Within Functional Limits for tasks assessed                      General Comments      Exercises Total Joint Exercises Ankle Circles/Pumps: AROM;Both;10 reps;Seated Long Arc Quad: AROM;Both;10  reps;Seated      Assessment/Plan    PT Assessment Patient needs continued PT services  PT Diagnosis Difficulty walking;Generalized weakness;Acute pain   PT Problem List Decreased strength;Decreased range of motion;Decreased balance;Decreased activity tolerance;Decreased mobility;Decreased knowledge of use of DME;Pain  PT Treatment Interventions DME instruction;Gait training;Stair training;Functional mobility training;Therapeutic activities;Therapeutic exercise;Balance training;Neuromuscular re-education;Patient/family education   PT Goals (Current goals can be found in the Care Plan section) Acute Rehab PT Goals Patient Stated Goal: to walk as much and exercise as much as possible  PT Goal Formulation: With patient Time For Goal Achievement: 02/10/14 Potential to Achieve Goals: Good    Frequency 7X/week   Barriers to discharge        Co-evaluation               End of Session Equipment Utilized During Treatment: Gait belt Activity Tolerance: Patient tolerated treatment well Patient left: in chair;with call bell/phone within reach Nurse Communication: Mobility status         Time: 2831-5176 PT Time Calculation (min): 17 min   Charges:   PT Evaluation $Initial PT Evaluation Tier I: 1 Procedure PT Treatments $Gait Training: 8-22 mins   PT G CodesGustavus Sheppard, [place] 02/08/2014, 4:14 PM

## 2014-02-08 NOTE — H&P (Signed)
TOTAL HIP ADMISSION H&P  Patient is admitted for right total hip arthroplasty.  Subjective:  Chief Complaint: right hip pain  HPI: Clifford Sheppard, 68 y.o. male, has a history of pain and functional disability in the right hip(s) due to arthritis and patient has failed non-surgical conservative treatments for greater than 12 weeks to include NSAID's and/or analgesics, flexibility and strengthening excercises, supervised PT with diminished ADL's post treatment, weight reduction as appropriate and activity modification.  Onset of symptoms was gradual starting 3 years ago with gradually worsening course since that time.The patient noted no past surgery on the right hip(s).  Patient currently rates pain in the right hip at 9 out of 10 with activity. Patient has night pain, worsening of pain with activity and weight bearing, trendelenberg gait, pain that interfers with activities of daily living, pain with passive range of motion, crepitus and joint swelling. Patient has evidence of subchondral sclerosis, periarticular osteophytes, joint subluxation and joint space narrowing by imaging studies. This condition presents safety issues increasing the risk of falls. This patient has had failure of all reasonable conservative care.  There is no current active infection.  Patient Active Problem List   Diagnosis Date Noted  . Coronary artery disease 11/30/2012  . HLD (hyperlipidemia) 11/30/2012   Past Medical History  Diagnosis Date  . CAD (coronary artery disease)   . HLD (hyperlipidemia)   . GERD (gastroesophageal reflux disease)   . H/O hiatal hernia   . Arthritis     Past Surgical History  Procedure Laterality Date  . 2-d echocardiogram  11/05/2004    ejection fraction greater than 55% normal wall motion. Left atrium mildly dilated. Aortic valve mildly sclerotic.  . Cardiac stress test  10/24/2006    post-rest ejection fraction 74% no significant wall motion abnormalities noted. Was evidence of mild  ischemia in the basal inferior and mid inferior region.  . Cardiac catheterization      10-12 YRS AGO   . Knee surgery      LEFT   ACL   1980S  . Refractive surgery      BILAT     Prescriptions prior to admission  Medication Sig Dispense Refill Last Dose  . aspirin 325 MG tablet Take 325 mg by mouth daily.   Past Week at Unknown time  . atorvastatin (LIPITOR) 80 MG tablet Take 80 mg by mouth daily.   02/07/2014 at Unknown time  . meloxicam (MOBIC) 15 MG tablet Take 15 mg by mouth daily as needed for pain.    Past Week at Unknown time  . metoprolol tartrate (LOPRESSOR) 25 MG tablet Take 0.5 tablets (12.5 mg total) by mouth 2 (two) times daily. 90 tablet 3 02/08/2014 at 0500  . Multiple Vitamin (MULTIVITAMIN) capsule Take 1 capsule by mouth daily.   Past Week at Unknown time  . Omega-3 Fatty Acids (FISH OIL PO) Take 1,400 mg by mouth daily.   Past Week at Unknown time  . omeprazole (PRILOSEC) 20 MG capsule Take 20 mg by mouth daily as needed (for acid reflux).   02/08/2014 at 0500  . REDNESS RELIEVER EYE DROPS 0.05 % ophthalmic solution Place 2 drops into both eyes as needed (for dry or red eyes).    Past Week at Unknown time  . sodium chloride (OCEAN) 0.65 % nasal spray Place 2-3 sprays into both nostrils as needed for congestion.    Past Week at Unknown time   No Known Allergies  History  Substance Use Topics  .  Smoking status: Never Smoker   . Smokeless tobacco: Not on file  . Alcohol Use: Yes     Comment: OCC     History reviewed. No pertinent family history.   ROS ROS: I have reviewed the patient's review of systems thoroughly and there are no positive responses as relates to the HPI. Objective:  Physical Exam  Vital signs in last 24 hours: Temp:  [98.1 F (36.7 C)] 98.1 F (36.7 C) (11/06 0557) Pulse Rate:  [64] 64 (11/06 0557) Resp:  [20] 20 (11/06 0557) BP: (150)/(81) 150/81 mmHg (11/06 0557) SpO2:  [100 %] 100 % (11/06 0557) Weight:  [170 lb (77.111 kg)] 170 lb (77.111  kg) (11/06 0557) Well-developed well-nourished patient in no acute distress. Alert and oriented x3 HEENT:within normal limits Cardiac: Regular rate and rhythm Pulmonary: Lungs clear to auscultation Abdomen: Soft and nontender.  Normal active bowel sounds  Musculoskeletal: (right hip: Painful range of motion.  Limited internal rotation.  Neurovascularly intact distally. Labs:   Estimated body mass index is 25.09 kg/(m^2) as calculated from the following:   Height as of this encounter: 5\' 9"  (1.753 m).   Weight as of this encounter: 170 lb (77.111 kg).   Imaging Review Plain radiographs demonstrate severe degenerative joint disease of the right hip(s). The bone quality appears to be good for age and reported activity level.  Assessment/Plan:  End stage arthritis, right hip(s)  The patient history, physical examination, clinical judgement of the provider and imaging studies are consistent with end stage degenerative joint disease of the right hip(s) and total hip arthroplasty is deemed medically necessary. The treatment options including medical management, injection therapy, arthroscopy and arthroplasty were discussed at length. The risks and benefits of total hip arthroplasty were presented and reviewed. The risks due to aseptic loosening, infection, stiffness, dislocation/subluxation,  thromboembolic complications and other imponderables were discussed.  The patient acknowledged the explanation, agreed to proceed with the plan and consent was signed. Patient is being admitted for inpatient treatment for surgery, pain control, PT, OT, prophylactic antibiotics, VTE prophylaxis, progressive ambulation and ADL's and discharge planning.The patient is planning to be discharged home with home health services

## 2014-02-08 NOTE — Brief Op Note (Signed)
02/08/2014  4:21 PM  PATIENT:  Clifford Sheppard  68 y.o. male  PRE-OPERATIVE DIAGNOSIS:  degenerative joint disease right hip  POST-OPERATIVE DIAGNOSIS:  * No post-op diagnosis entered *  PROCEDURE:  Procedure(s): RIGHT TOTAL HIP ARTHROPLASTY ANTERIOR APPROACH (Right)  SURGEON:  Surgeon(s) and Role:    * Alta Corning, MD - Primary  PHYSICIAN ASSISTANT:   ASSISTANTS: bethune   ANESTHESIA:   general  EBL:  Total I/O In: 2790 [P.O.:240; I.V.:2300; IV Piggyback:250] Out: 600 [Blood:600]  BLOOD ADMINISTERED:none  DRAINS: none   LOCAL MEDICATIONS USED:  OTHER experel  SPECIMEN:  No Specimen  DISPOSITION OF SPECIMEN:  N/A  COUNTS:  YES  TOURNIQUET:  * No tourniquets in log *  DICTATION: .Other Dictation: Dictation Number [id number]  PLAN OF CARE: Admit to inpatient   PATIENT DISPOSITION:  PACU - hemodynamically stable.   Delay start of Pharmacological VTE agent (>24hrs) due to surgical blood loss or risk of bleeding: no

## 2014-02-08 NOTE — Anesthesia Procedure Notes (Signed)
Procedure Name: Intubation Date/Time: 02/08/2014 7:35 AM Performed by: Neldon Newport Pre-anesthesia Checklist: Patient identified, Timeout performed, Emergency Drugs available, Suction available and Patient being monitored Patient Re-evaluated:Patient Re-evaluated prior to inductionOxygen Delivery Method: Circle system utilized Preoxygenation: Pre-oxygenation with 100% oxygen Intubation Type: IV induction Ventilation: Mask ventilation without difficulty Laryngoscope Size: Mac and 3 Grade View: Grade II Tube type: Oral Tube size: 7.5 mm Number of attempts: 1 Placement Confirmation: positive ETCO2,  ETT inserted through vocal cords under direct vision and breath sounds checked- equal and bilateral Secured at: 22 cm Tube secured with: Tape Dental Injury: Bloody posterior oropharynx

## 2014-02-08 NOTE — Plan of Care (Signed)
Problem: Phase I Progression Outcomes Goal: CMS/Neurovascular status WDL Outcome: Completed/Met Date Met:  02/08/14 Goal: Pain controlled with appropriate interventions Outcome: Completed/Met Date Met:  02/08/14 Goal: Initial discharge plan identified Outcome: Completed/Met Date Met:  02/08/14 Goal: Hemodynamically stable Outcome: Completed/Met Date Met:  02/08/14 Goal: Other Phase I Outcomes/Goals Outcome: Not Applicable Date Met:  71/99/41

## 2014-02-08 NOTE — Anesthesia Postprocedure Evaluation (Signed)
  Anesthesia Post-op Note  Patient: Clifford Sheppard  Procedure(s) Performed: Procedure(s): RIGHT TOTAL HIP ARTHROPLASTY ANTERIOR APPROACH (Right)  Patient Location: PACU  Anesthesia Type:General  Level of Consciousness: awake and alert   Airway and Oxygen Therapy: Patient Spontanous Breathing  Post-op Pain: mild  Post-op Assessment: Post-op Vital signs reviewed, Patient's Cardiovascular Status Stable and Respiratory Function Stable  Post-op Vital Signs: Reviewed  Filed Vitals:   02/08/14 1148  BP: 128/71  Pulse: 82  Temp:   Resp: 13    Complications: No apparent anesthesia complications

## 2014-02-08 NOTE — Anesthesia Preprocedure Evaluation (Addendum)
Anesthesia Evaluation  Patient identified by MRN, date of birth, ID band Patient awake    Reviewed: Allergy & Precautions, H&P , NPO status , Patient's Chart, lab work & pertinent test results, reviewed documented beta blocker date and time   Airway Mallampati: II  TM Distance: >3 FB Neck ROM: Full    Dental no notable dental hx. (+) Teeth Intact, Dental Advisory Given   Pulmonary neg pulmonary ROS,  breath sounds clear to auscultation  Pulmonary exam normal       Cardiovascular hypertension, On Medications and On Home Beta Blockers + CAD negative cardio ROS  Rhythm:Regular Rate:Normal     Neuro/Psych negative neurological ROS  negative psych ROS   GI/Hepatic Neg liver ROS, hiatal hernia, GERD-  Medicated and Controlled,  Endo/Other  negative endocrine ROS  Renal/GU negative Renal ROS  negative genitourinary   Musculoskeletal   Abdominal   Peds  Hematology negative hematology ROS (+)   Anesthesia Other Findings   Reproductive/Obstetrics negative OB ROS                            Anesthesia Physical Anesthesia Plan  ASA: III  Anesthesia Plan: General   Post-op Pain Management:    Induction: Intravenous  Airway Management Planned: Oral ETT  Additional Equipment:   Intra-op Plan:   Post-operative Plan: Extubation in OR  Informed Consent: I have reviewed the patients History and Physical, chart, labs and discussed the procedure including the risks, benefits and alternatives for the proposed anesthesia with the patient or authorized representative who has indicated his/her understanding and acceptance.   Dental advisory given and Dental Advisory Given  Plan Discussed with: CRNA, Surgeon and Anesthesiologist  Anesthesia Plan Comments:       Anesthesia Quick Evaluation

## 2014-02-08 NOTE — Anesthesia Postprocedure Evaluation (Signed)
Anesthesia Post Note  Patient: Clifford Sheppard  Procedure(s) Performed: Procedure(s) (LRB): RIGHT TOTAL HIP ARTHROPLASTY ANTERIOR APPROACH (Right)  Anesthesia type: general  Patient location: PACU  Post pain: Pain level controlled  Post assessment: Patient's Cardiovascular Status Stable  Last Vitals:  Filed Vitals:   02/08/14 1248  BP: 116/22  Pulse: 85  Temp:   Resp: 12    Post vital signs: Reviewed and stable  Level of consciousness: sedated  Complications: No apparent anesthesia complications

## 2014-02-08 NOTE — Progress Notes (Signed)
Occupational Therapy Evaluation Patient Details Name: Clifford Sheppard MRN: [id number] DOB: 29-Apr-1945 Today's Date: 02/08/2014    History of Present Illness Clifford Sheppard is a 68 y.o. Male s/p Right THA direct anterior approach on 02/08/14. PMH of CAD, HLD, CAD, GERD, and arthritis.   Clinical Impression   PTA pt lived at home and was independent with ADLs. Pt currently limited by decreased ROM and increased pain, however pt is motivated to return to independence. Pt and wife educated on safety with ADLs. Pt will continue to benefit from acute OT to address LB ADLs and functional mobility.     Follow Up Recommendations  No OT follow up;Supervision - Intermittent    Equipment Recommendations  3 in 1 bedside comode    Recommendations for Other Services       Precautions / Restrictions Restrictions Weight Bearing Restrictions: No RLE Weight Bearing: Weight bearing as tolerated      Mobility Bed Mobility               General bed mobility comments: Pt sitting up in recliner  Transfers Overall transfer level: Needs assistance Equipment used: Rolling walker (2 wheeled) Transfers: Sit to/from Stand Sit to Stand: Supervision         General transfer comment: Pt with good hand placement. VC for controlled ascent/descent. Pt improved with practice.          ADL Overall ADL's : Needs assistance/impaired Eating/Feeding: Independent;Sitting   Grooming: Supervision/safety;Standing   Upper Body Bathing: Set up;Sitting   Lower Body Bathing: Minimal assistance;Sit to/from stand   Upper Body Dressing : Set up;Sitting   Lower Body Dressing: Moderate assistance;Sit to/from stand Lower Body Dressing Details (indicate cue type and reason): pt attempted to reach feet to practice tying his shoes but was unable to reach. discussed slowly stretching to reach as pain allows. Pt was able to independently don underwear and pj pants.   Toilet Transfer:  Supervision/safety;Ambulation;RW (sit<>stand from recliner)   Toileting- Clothing Manipulation and Hygiene: Supervision/safety;Sit to/from stand   Tub/ Shower Transfer: Walk-in shower;Supervision/safety;Ambulation;Rolling walker;Shower seat   Functional mobility during ADLs: Supervision/safety;Rolling walker General ADL Comments: Pt with excellent progress today. Pt and wife present for education on fall prevention and energy conservation and reviewed compensatory techniques for LB ADLs.      Vision  Pt wears glasses and reports no change from baseline. Right eye is red and irritated and pt reports that he has a minor "abrasion" on his eye. RN is providing eye drops PRN. Recommended to pt that he use a warm or cool washrag on his eye as needed to soothe irritation.                    Perception Perception Perception Tested?: No   Praxis Praxis Praxis tested?: Within functional limits    Pertinent Vitals/Pain Pain Assessment: No/denies pain ("soreness in my thigh")        Extremity/Trunk Assessment Upper Extremity Assessment Upper Extremity Assessment: Overall WFL for tasks assessed   Lower Extremity Assessment Lower Extremity Assessment: Defer to PT evaluation   Cervical / Trunk Assessment Cervical / Trunk Assessment: Normal   Communication Communication Communication: No difficulties   Cognition Arousal/Alertness: Awake/alert Behavior During Therapy: WFL for tasks assessed/performed Overall Cognitive Status: Within Functional Limits for tasks assessed                                Home Living  Family/patient expects to be discharged to:: Private residence Living Arrangements: Spouse/significant other Available Help at Discharge: Family;Available 24 hours/day Type of Home: House Home Access: Stairs to enter     Home Layout: One level     Bathroom Shower/Tub: Occupational psychologist: Standard     Home Equipment: Shower seat - built  in;Grab bars - tub/shower          Prior Functioning/Environment Level of Independence: Independent             OT Diagnosis: Generalized weakness;Acute pain   OT Problem List: Decreased strength;Decreased range of motion;Decreased knowledge of use of DME or AE;Pain   OT Treatment/Interventions: Self-care/ADL training;Energy conservation;DME and/or AE instruction;Therapeutic activities;Patient/family education    OT Goals(Current goals can be found in the care plan section) Acute Rehab OT Goals Patient Stated Goal: to do more speed walking OT Goal Formulation: With patient Time For Goal Achievement: 02/22/14 Potential to Achieve Goals: Good ADL Goals Pt Will Perform Grooming: with modified independence;standing Pt Will Perform Lower Body Bathing: with set-up;with supervision;sit to/from stand Pt Will Perform Lower Body Dressing: with set-up;with supervision;sit to/from stand Pt Will Transfer to Toilet: with modified independence;ambulating;bedside commode  OT Frequency: Min 1X/week    End of Session Equipment Utilized During Treatment: Gait belt;Rolling walker Nurse Communication: Other (comment) (pt call bell not working; notified front desk )  Activity Tolerance: Patient tolerated treatment well Patient left: in chair;with call bell/phone within reach   Time: 1451-1525 OT Time Calculation (min): 34 min Charges:  OT General Charges $OT Visit: 1 Procedure OT Evaluation $Initial OT Evaluation Tier I: 1 Procedure OT Treatments $Self Care/Home Management : 8-22 mins $Therapeutic Activity: 8-22 mins  Clifford Sheppard 02/08/2014, 3:43 PM  Secundino Ginger Clifford Sheppard, OTR/L Occupational Therapist [phone] (pager)

## 2014-02-08 NOTE — Discharge Instructions (Signed)
Total Hip Replacement, Care After °Refer to this sheet in the next few weeks. These instructions provide you with information on caring for yourself after your procedure. Your health care provider may also give you specific instructions. Your treatment has been planned according to the most current medical practices, but problems sometimes occur. Call your health care provider if you have any problems or questions after your procedure. °HOME CARE INSTRUCTIONS  °Your health care provider will give you specific precautions for certain types of movement. Additional instructions include: °· Take medicines only as directed by your health care provider. °· Take quick showers (3-5 min) rather than bathe until your health care provider tells you that you can take baths again. °· Avoid lifting until your health care provider instructs you otherwise. °· Use a raised toilet seat and avoid sitting in low chairs as instructed by your health care provider. °· Use crutches or a walker as instructed by your health care provider. °SEEK MEDICAL CARE IF: °· You have difficulty breathing. °· You have drainage, redness, or swelling at your incision site. °· You have a bad smell coming from your incision site. °· You have persistent bleeding from your incision site. °· Your incision breaks open after sutures (stitches) or staples have been removed. °· You have a fever. °SEEK IMMEDIATE MEDICAL CARE IF:  °· You have a rash. °· You have pain or swelling in your calf or thigh. °· You have shortness of breath or chest pain. °MAKE SURE YOU: °· Understand these instructions. °· Will watch your condition. °· Will get help if you are not doing well or get worse. °Document Released: 10/09/2004 Document Revised: 08/06/2013 Document Reviewed: 05/23/2013 °[employer]® Patient Information ©2015 [employer]. This information is not intended to replace advice given to you by your health care provider. Make sure you discuss any questions you have with  your health care provider. ° °

## 2014-02-08 NOTE — Transfer of Care (Signed)
Immediate Anesthesia Transfer of Care Note  Patient: Clifford Sheppard  Procedure(s) Performed: Procedure(s): RIGHT TOTAL HIP ARTHROPLASTY ANTERIOR APPROACH (Right)  Patient Location: PACU  Anesthesia Type:General  Level of Consciousness: awake, alert  and oriented  Airway & Oxygen Therapy: Patient Spontanous Breathing and Patient connected to nasal cannula oxygen  Post-op Assessment: Report given to PACU RN and Post -op Vital signs reviewed and stable  Post vital signs: Reviewed and stable  Complications: No apparent anesthesia complications

## 2014-02-08 NOTE — Progress Notes (Signed)
Utilization review completed.  

## 2014-02-08 NOTE — Progress Notes (Signed)
Dr Ola Spurr notified pts rt eye has redness, soreness.  Orders received

## 2014-02-08 NOTE — Plan of Care (Signed)
Problem: Phase II Progression Outcomes Goal: Ambulates Outcome: Progressing Goal: Tolerating diet Outcome: Progressing Goal: Discharge plan established Outcome: Progressing  Problem: Phase III Progression Outcomes Goal: Pain controlled on oral analgesia Outcome: Progressing

## 2014-02-09 LAB — CBC
HCT: 33.6 % — ABNORMAL LOW (ref 39.0–52.0)
HEMOGLOBIN: 11.6 g/dL — AB (ref 13.0–17.0)
MCH: 31.2 pg (ref 26.0–34.0)
MCHC: 34.5 g/dL (ref 30.0–36.0)
MCV: 90.3 fL (ref 78.0–100.0)
PLATELETS: 158 10*3/uL (ref 150–400)
RBC: 3.72 MIL/uL — ABNORMAL LOW (ref 4.22–5.81)
RDW: 12.6 % (ref 11.5–15.5)
WBC: 12.9 10*3/uL — ABNORMAL HIGH (ref 4.0–10.5)

## 2014-02-09 LAB — BASIC METABOLIC PANEL
Anion gap: 10 (ref 5–15)
BUN: 18 mg/dL (ref 6–23)
CO2: 27 mEq/L (ref 19–32)
Calcium: 8.7 mg/dL (ref 8.4–10.5)
Chloride: 100 mEq/L (ref 96–112)
Creatinine, Ser: 0.94 mg/dL (ref 0.50–1.35)
GFR calc non Af Amer: 84 mL/min — ABNORMAL LOW (ref >90)
GLUCOSE: 139 mg/dL — AB (ref 70–99)
Potassium: 4.6 mEq/L (ref 3.7–5.3)
SODIUM: 137 meq/L (ref 137–147)

## 2014-02-09 NOTE — Op Note (Signed)
NAMERAVIS, HERNE                 ACCOUNT NO.:  [id number]  MEDICAL RECORD NO.:  [id number]  LOCATION:  [id number]                        FACILITY:  [facility]  PHYSICIAN:  Alta Corning, M.D.   DATE OF BIRTH:  [birth year]  DATE OF PROCEDURE:  02/08/2014 DATE OF DISCHARGE:                              OPERATIVE REPORT   PREOPERATIVE DIAGNOSIS:  End-stage degenerative joint disease, right hip.  POSTOPERATIVE DIAGNOSIS:  End-stage degenerative joint disease, right hip.  PROCEDURE:  Right total hip replacement through an anterior approach with a Corail stem size 15 with a high offset, a 56 mm Pinnacle cup with a +4 neutral liner.  SURGEON:  Alta Corning, M.D.  ASSISTANT:  Gary Fleet, P.A.  ANESTHESIA:  General.  BRIEF HISTORY:  Mr. Meadowcroft is a 68 year old male with a long history of having had complaints of right hip pain.  He was evaluated in the office and noted to have severe arthritis of the right hip.  He was treated conservatively for a period of time and after failure of all conservative care because of night pain and light activity pain, he is taken to the operating room for total hip replacement.  Because of his youthful appearance and high activity level, we felt that an anterior approach was appropriate and this was chosen to be used preoperatively.  PROCEDURE:  The patient was taken to the operating room.  After adequate anesthesia was achieved with general anesthetic, the patient was placed supine on the operating table.  Then he was placed on to the HANA bed and preoperative images were taken and stored.  At this point, attention was turned to the right hip where after routine prep and drape, an incision was made just distal and lateral to the anterior-superior iliac spine spiraling down towards the femur, subcutaneous tissue down the level of the tensor fascia, which was divided, the fascia was divided and tensor fascia was retracted laterally and retractors were  put in place anterior and posterior to the hip joint.  Once this was done, the capsule was opened and tagged and retractors were replaced inside the capsule.  At this point, drill and reamer was used into the head and the head was externally rotated, 50 degrees, it was then free handed out to 80 degrees, put a Cobb in and around the head to make sure that we had freed up the hip joint.  Once this was done, attention was turned towards provisional neck cut, which was made and then the head was removed.  The acetabulum was identified and retractors put in place. Labrectomy was performed and the acetabulum was then sequentially reamed to a level of 55 mm and a 56 mm cup was hammered into place.  Excellent stability was achieved.  Attention was then turned towards the stem side where the hip was externally rotated to 100 degrees, placed in an extended and adducted position with the lever underneath the proximal femur.  This was elevated and attention was turned towards the hip, which was sequentially rasped to a level of 15 and a 15 mm trial was used initially put in the standard offset, but the high offset seemed to give  better leg lengths and range of motion and stability.  At this point, the fluoro was used to evaluate leg length and stability.  At this point, the trial was removed, a 15 high offset Corail stem was put in place, +0 ball which was previously been chosen, and this was reduced.  Excellent stability and range of motion was achieved at this point.  The anterior capsule was closed with 0-Vicryl running and also out to the lateral side of the capsule.  The tensor fascia was closed with 1-Vicryl running, skin with 0 and 2-0 Vicryl and 3-0 Monocryl subcuticular.  Benzoin and Steri- Strips were applied.  Sterile compressive dressing was applied.  The patient was taken to the recovery room and was noted to be in a satisfactory condition.  Estimated blood loss for the procedure  was about 600 mL.     Alta Corning, M.D.     Corliss Skains  D:  02/08/2014  T:  02/09/2014  Job:  [id number]

## 2014-02-09 NOTE — Care Management Note (Signed)
    Page 1 of 2   02/09/2014     11:01:48 AM CARE MANAGEMENT NOTE 02/09/2014  Patient:  Clifford Sheppard, Clifford Sheppard   Account Number:  [id number]  Date Initiated:  02/09/2014  Documentation initiated by:  Norina Buzzard  Subjective/Objective Assessment:   ADM OA RH     Action/Plan:   D/C planning   Anticipated DC Date:  02/09/2014   Anticipated DC Plan:  [place]  CM consult      [facility] Choice  HOME HEALTH   Choice offered to / List presented to:  C-1 Patient   DME arranged  NA      DME agency  NA     [place] arranged  HH-2 PT      [place].   Status of service:  Completed, signed off Medicare Important Message given?   (If response is "NO", the following Medicare IM given date fields will be blank) Date Medicare IM given:   Medicare IM given by:   Date Additional Medicare IM given:   Additional Medicare IM given by:    Discharge Disposition:  [place]  Per UR Regulation:    If discussed at Long Length of Stay Meetings, dates discussed:    Comments:  02/09/14 10:55 CM met with pt in room to offer choice of home health agency.  Pt chooses [facility] to render HHPT.  CM explained bc he has [employer] it will be a 4-6 hour turnaround.  Pt has opted to go to [facility] supplies for his cane and has decided he does not need his 3n1 bc he has a counter to the right of his already elevated commode in the bathroom.  Referral called to [place] [facility].  No other CM needs were communicated. Mariane Masters, BSN, CM [phone].

## 2014-02-09 NOTE — Discharge Summary (Signed)
Patient ID: NYCERE PRESLEY MRN: [id number] DOB/AGE: 07/22/1945 68 y.o.  Admit date: 02/08/2014 Discharge date: 02/09/2014  Admission Diagnoses:  Principal Problem:   Primary osteoarthritis of right hip   Discharge Diagnoses:  Same  Past Medical History  Diagnosis Date  . CAD (coronary artery disease)   . HLD (hyperlipidemia)   . GERD (gastroesophageal reflux disease)   . H/O hiatal hernia   . Arthritis     Surgeries: Procedure(s): RIGHT TOTAL HIP ARTHROPLASTY ANTERIOR APPROACH on 02/08/2014   Consultants:    Discharged Condition: Improved  Hospital Course: Clifford Sheppard is an 68 y.o. male who was admitted 02/08/2014 for operative treatment ofPrimary osteoarthritis of right hip. Patient has severe unremitting pain that affects sleep, daily activities, and work/hobbies. After pre-op clearance the patient was taken to the operating room on 02/08/2014 and underwent  Procedure(s): RIGHT TOTAL HIP ARTHROPLASTY ANTERIOR APPROACH.    Patient was given perioperative antibiotics: Anti-infectives    Start     Dose/Rate Route Frequency Ordered Stop   02/08/14 1345  ceFAZolin (ANCEF) IVPB 2 g/50 mL premix     2 g100 mL/hr over 30 Minutes Intravenous Every 6 hours 02/08/14 1331 02/08/14 2027   02/08/14 0600  ceFAZolin (ANCEF) IVPB 2 g/50 mL premix     2 g100 mL/hr over 30 Minutes Intravenous On call to O.R. 02/07/14 1436 02/08/14 0752       Patient was given sequential compression devices, early ambulation, and chemoprophylaxis to prevent DVT.  Patient benefited maximally from hospital stay and there were no complications.    Recent vital signs: Patient Vitals for the past 24 hrs:  BP Temp Temp src Pulse Resp SpO2  02/09/14 0549 107/68 mmHg 98.5 F (36.9 C) Oral 66 - 100 %  02/09/14 0154 (!) 111/58 mmHg 99.1 F (37.3 C) Oral 63 - 97 %  02/08/14 2036 130/69 mmHg 98.9 F (37.2 C) Oral 83 - 98 %  02/08/14 1330 130/76 mmHg 98.7 F (37.1 C) Oral 90 18 100 %  02/08/14 1308 - - - 84 18  100 %  02/08/14 1303 139/69 mmHg 97.6 F (36.4 C) - 85 13 100 %  02/08/14 1300 - - - 89 16 100 %  02/08/14 1248 (!) 116/22 mmHg - - 85 12 100 %  02/08/14 1245 - - - 70 10 100 %  02/08/14 1233 (!) 128/56 mmHg - - 73 14 100 %  02/08/14 1230 - - - 63 10 100 %  02/08/14 1218 119/69 mmHg - - 71 15 100 %  02/08/14 1215 - - - (!) 59 13 100 %  02/08/14 1210 - - - (!) 57 (!) 9 100 %  02/08/14 1203 130/68 mmHg - - 60 (!) 9 100 %  02/08/14 1200 - - - 64 12 100 %  02/08/14 1148 128/71 mmHg - - 82 13 95 %  02/08/14 1145 - - - 71 17 100 %  02/08/14 1133 133/75 mmHg - - 66 16 100 %  02/08/14 1130 - - - (!) 55 12 100 %  02/08/14 1118 116/61 mmHg - - 72 18 100 %  02/08/14 1115 - - - 66 17 100 %  02/08/14 1110 - - - 69 19 100 %  02/08/14 1103 (!) 117/49 mmHg - - 62 17 100 %  02/08/14 1100 - - - 70 13 100 %  02/08/14 1048 123/66 mmHg - - 73 16 100 %  02/08/14 1045 - - - 78 20 100 %  02/08/14 1033  123/75 mmHg 98.4 F (36.9 C) - 81 11 95 %     Recent laboratory studies:  Recent Labs  02/09/14 0453  WBC 12.9*  HGB 11.6*  HCT 33.6*  PLT 158  NA 137  K 4.6  CL 100  CO2 27  BUN 18  CREATININE 0.94  GLUCOSE 139*  CALCIUM 8.7     Discharge Medications:     Medication List    STOP taking these medications        aspirin 325 MG tablet  Replaced by:  aspirin EC 325 MG tablet     meloxicam 15 MG tablet  Commonly known as:  MOBIC      TAKE these medications        aspirin EC 325 MG tablet  Take 1 tablet (325 mg total) by mouth 2 (two) times daily after a meal.     atorvastatin 80 MG tablet  Commonly known as:  LIPITOR  Take 80 mg by mouth daily.     FISH OIL PO  Take 1,400 mg by mouth daily.     methocarbamol 750 MG tablet  Commonly known as:  ROBAXIN-750  Take 1 tablet (750 mg total) by mouth every 8 (eight) hours as needed for muscle spasms.     metoprolol tartrate 25 MG tablet  Commonly known as:  LOPRESSOR  Take 0.5 tablets (12.5 mg total) by mouth 2 (two) times  daily.     multivitamin capsule  Take 1 capsule by mouth daily.     omeprazole 20 MG capsule  Commonly known as:  PRILOSEC  Take 20 mg by mouth daily as needed (for acid reflux).     oxyCODONE-acetaminophen 5-325 MG per tablet  Commonly known as:  PERCOCET/ROXICET  Take 1-2 tablets by mouth every 4 (four) hours as needed.     REDNESS RELIEVER EYE DROPS 0.05 % ophthalmic solution  Generic drug:  tetrahydrozoline  Place 2 drops into both eyes as needed (for dry or red eyes).     sodium chloride 0.65 % nasal spray  Commonly known as:  OCEAN  Place 2-3 sprays into both nostrils as needed for congestion.        Diagnostic Studies: Dg Chest 2 View  01/30/2014   CLINICAL DATA:  68 year old male with preoperative chest x-ray for right hip surgery. No acute complaints.  EXAM: CHEST - 2 VIEW  COMPARISON:  None.  FINDINGS: Cardiomediastinal silhouette projects within normal limits in size and contour. No confluent airspace disease, pneumothorax, or pleural effusion.  No displaced fracture.  Unremarkable appearance of the upper abdomen.  IMPRESSION: No radiographic evidence of acute cardiopulmonary disease.  Signed,  Dulcy Fanny. Earleen Newport, DO  [facility]  [facility]   Electronically Signed   By: Corrie Mckusick D.O.   On: 01/30/2014 16:58   Dg Hip Operative Right  02/08/2014   CLINICAL DATA:  Right anterior hip.  Fluoroscopy time 37 seconds.  EXAM: DG OPERATIVE RIGHT HIP 1-2 VIEWS  TECHNIQUE: Fluoroscopic spot image(s) were submitted for interpretation post-operatively.  COMPARISON:  07/25/2013  FINDINGS: The patient has undergone right hip arthroplasty. On the anterior views provided the femoral head prosthesis is well-seated in the acetabular component. No evidence for acute fracture or subluxation.  IMPRESSION: Status post right hip arthroplasty.  No adverse features identified.   Electronically Signed   By: Shon Hale M.D.   On: 02/08/2014 10:08   Dg  Pelvis Portable  02/08/2014   CLINICAL DATA:  Total  hip replacement  EXAM: PORTABLE PELVIS 1-2 VIEWS  COMPARISON:  None.  FINDINGS: Total hip replacement is noted on the right with prosthetic components appearing well-seated. No acute fracture or dislocation. There is mild narrowing of the left hip joint.  IMPRESSION: Prosthetic components on the right appear well seated.   Electronically Signed   By: Lowella Grip M.D.   On: 02/08/2014 11:03   Dg Hip Portable 1 View Right  02/08/2014   CLINICAL DATA:  Status post total hip replacement  EXAM: PORTABLE RIGHT HIP - 1 VIEW  COMPARISON:  None.  FINDINGS: There is a total hip replacement on the right with prosthetic components appearing well-seated. No fracture or dislocation appreciable on this lateral view.  IMPRESSION: Prosthetic components appear well seated.   Electronically Signed   By: Lowella Grip M.D.   On: 02/08/2014 11:04    Disposition: Final discharge disposition not confirmed      Discharge Instructions    Call MD / Call 911    Complete by:  As directed   If you experience chest pain or shortness of breath, CALL 911 and be transported to the hospital emergency room.  If you develope a fever above 101 F, pus (white drainage) or increased drainage or redness at the wound, or calf pain, call your surgeon's office.     Constipation Prevention    Complete by:  As directed   Drink plenty of fluids.  Prune juice may be helpful.  You may use a stool softener, such as Colace (over the counter) 100 mg twice a day.  Use MiraLax (over the counter) for constipation as needed.     Diet - low sodium heart healthy    Complete by:  As directed      Increase activity slowly as tolerated    Complete by:  As directed      Weight bearing as tolerated    Complete by:  As directed   Laterality:  right  Extremity:  Lower           Follow-up Information    Follow up with GRAVES,JOHN L, MD. Schedule an appointment as soon as possible for a  visit in 2 weeks.   Specialty:  Orthopedic Surgery   Contact information:   [place] [id number] [phone]        Signed: Rich Fuchs 02/09/2014, 8:32 AM

## 2014-02-09 NOTE — Progress Notes (Signed)
Physical Therapy Treatment Patient Details Name: KARION CUDD MRN: [id number] DOB: Aug 14, 1945 Today's Date: 02/09/2014    History of Present Illness Quanell D. [place]lin is a 68 y.o. Male s/p Right THA direct anterior approach on 02/08/14. PMH of CAD, HLD, CAD, GERD, and arthritis.    PT Comments    Patient progressing well. Answered question regarding home setup and safety. Patient safe to D/C from a mobility standpoint based on progression towards goals set on PT eval.    Follow Up Recommendations  Outpatient PT;Supervision - Intermittent     Equipment Recommendations  Rolling walker with 5" wheels    Recommendations for Other Services       Precautions / Restrictions Precautions Precautions: None Precaution Comments: no precautions Restrictions RLE Weight Bearing: Weight bearing as tolerated    Mobility  Bed Mobility Overal bed mobility: Modified Independent                Transfers Overall transfer level: Modified independent                  Ambulation/Gait Ambulation/Gait assistance: Supervision Ambulation Distance (Feet): 600 Feet Assistive device: Rolling walker (2 wheeled)   Gait velocity: decreased   General Gait Details: Cues for safety with RW   Stairs Stairs: Yes Stairs assistance: Min guard Stair Management: Step to pattern;Forwards;Two rails Number of Stairs: 5 General stair comments: Cues for sequency and technique  Wheelchair Mobility    Modified Rankin (Stroke Patients Only)       Balance                                    Cognition Arousal/Alertness: Awake/alert Behavior During Therapy: WFL for tasks assessed/performed Overall Cognitive Status: Within Functional Limits for tasks assessed                      Exercises      General Comments        Pertinent Vitals/Pain Pain Assessment: No/denies pain    Home Living                      Prior Function            PT Goals  (current goals can now be found in the care plan section) Progress towards PT goals: Progressing toward goals    Frequency  7X/week    PT Plan Current plan remains appropriate    Co-evaluation             End of Session   Activity Tolerance: Patient tolerated treatment well Patient left: in chair;with call bell/phone within reach     Time: 0804-0828 PT Time Calculation (min): 24 min  Charges:  $Gait Training: 23-37 mins                    G Codes:      Jacqualyn Posey 02/09/2014, 11:48 AM 02/09/2014 Jacqualyn Posey PTA [phone] pager [phone] office

## 2014-02-09 NOTE — Progress Notes (Signed)
Subjective: 1 Day Post-Op Procedure(s) (LRB): RIGHT TOTAL HIP ARTHROPLASTY ANTERIOR APPROACH (Right)  Activity level:  wbat Diet tolerance:  Eating well Voiding:  ok Patient reports pain as mild.    Objective: Vital signs in last 24 hours: Temp:  [97.6 F (36.4 C)-99.1 F (37.3 C)] 98.5 F (36.9 C) (11/07 0549) Pulse Rate:  [55-90] 66 (11/07 0549) Resp:  [9-20] 18 (11/06 1330) BP: (107-139)/(22-76) 107/68 mmHg (11/07 0549) SpO2:  [95 %-100 %] 100 % (11/07 0549)  Labs:  Recent Labs  02/09/14 0453  HGB 11.6*    Recent Labs  02/09/14 0453  WBC 12.9*  RBC 3.72*  HCT 33.6*  PLT 158    Recent Labs  02/09/14 0453  NA 137  K 4.6  CL 100  CO2 27  BUN 18  CREATININE 0.94  GLUCOSE 139*  CALCIUM 8.7   No results for input(s): LABPT, INR in the last 72 hours.  Physical Exam:  Neurologically intact ABD soft Neurovascular intact Sensation intact distally Intact pulses distally Dorsiflexion/Plantar flexion intact Incision: dressing C/D/I No cellulitis present Compartment soft  Assessment/Plan:  1 Day Post-Op Procedure(s) (LRB): RIGHT TOTAL HIP ARTHROPLASTY ANTERIOR APPROACH (Right) Advance diet Up with therapy Discharge home with home health today Continue on ASA 325 MG BID x 4 weeks post op. Follow up in office 2 weeks post op.     Clifford Sheppard, Larwance Sachs 02/09/2014, 8:34 AM

## 2014-02-12 ENCOUNTER — Encounter (HOSPITAL_COMMUNITY): Payer: Self-pay | Admitting: Orthopedic Surgery

## 2014-04-30 DIAGNOSIS — Z96641 Presence of right artificial hip joint: Secondary | ICD-10-CM | POA: Diagnosis not present

## 2014-04-30 DIAGNOSIS — M545 Low back pain: Secondary | ICD-10-CM | POA: Diagnosis not present

## 2014-04-30 DIAGNOSIS — M25551 Pain in right hip: Secondary | ICD-10-CM | POA: Diagnosis not present

## 2014-04-30 DIAGNOSIS — Z9889 Other specified postprocedural states: Secondary | ICD-10-CM | POA: Diagnosis not present

## 2014-05-02 DIAGNOSIS — M545 Low back pain: Secondary | ICD-10-CM | POA: Diagnosis not present

## 2014-05-02 DIAGNOSIS — M47896 Other spondylosis, lumbar region: Secondary | ICD-10-CM | POA: Diagnosis not present

## 2014-05-10 DIAGNOSIS — M545 Low back pain: Secondary | ICD-10-CM | POA: Diagnosis not present

## 2014-05-10 DIAGNOSIS — M47896 Other spondylosis, lumbar region: Secondary | ICD-10-CM | POA: Diagnosis not present

## 2014-05-17 DIAGNOSIS — M545 Low back pain: Secondary | ICD-10-CM | POA: Diagnosis not present

## 2014-05-17 DIAGNOSIS — M47896 Other spondylosis, lumbar region: Secondary | ICD-10-CM | POA: Diagnosis not present

## 2015-02-13 DIAGNOSIS — E782 Mixed hyperlipidemia: Secondary | ICD-10-CM | POA: Diagnosis not present

## 2015-02-13 DIAGNOSIS — Z125 Encounter for screening for malignant neoplasm of prostate: Secondary | ICD-10-CM | POA: Diagnosis not present

## 2015-02-13 DIAGNOSIS — L570 Actinic keratosis: Secondary | ICD-10-CM | POA: Diagnosis not present

## 2015-02-13 DIAGNOSIS — I10 Essential (primary) hypertension: Secondary | ICD-10-CM | POA: Diagnosis not present

## 2015-02-13 DIAGNOSIS — R7301 Impaired fasting glucose: Secondary | ICD-10-CM | POA: Diagnosis not present

## 2015-02-13 DIAGNOSIS — Z0000 Encounter for general adult medical examination without abnormal findings: Secondary | ICD-10-CM | POA: Diagnosis not present

## 2015-02-13 DIAGNOSIS — N400 Enlarged prostate without lower urinary tract symptoms: Secondary | ICD-10-CM | POA: Diagnosis not present

## 2015-02-13 DIAGNOSIS — I2510 Atherosclerotic heart disease of native coronary artery without angina pectoris: Secondary | ICD-10-CM | POA: Diagnosis not present

## 2015-03-14 ENCOUNTER — Ambulatory Visit (INDEPENDENT_AMBULATORY_CARE_PROVIDER_SITE_OTHER): Payer: Commercial Managed Care - HMO | Admitting: Cardiovascular Disease

## 2015-03-14 ENCOUNTER — Encounter: Payer: Self-pay | Admitting: Cardiovascular Disease

## 2015-03-14 VITALS — BP 138/80 | HR 64 | Ht 69.0 in | Wt 177.0 lb

## 2015-03-14 DIAGNOSIS — I2583 Coronary atherosclerosis due to lipid rich plaque: Secondary | ICD-10-CM

## 2015-03-14 DIAGNOSIS — E785 Hyperlipidemia, unspecified: Secondary | ICD-10-CM

## 2015-03-14 DIAGNOSIS — I2510 Atherosclerotic heart disease of native coronary artery without angina pectoris: Secondary | ICD-10-CM | POA: Diagnosis not present

## 2015-03-14 MED ORDER — METOPROLOL TARTRATE 25 MG PO TABS: 12.5000 mg | ORAL_TABLET | Freq: Two times a day (BID) | ORAL | Status: DC

## 2015-03-14 NOTE — Assessment & Plan Note (Signed)
History of CAD status post catheterization by myself May 1998 revealing subtotal RCA with left right collaterals. The circumflex did have a 75% mid stenosis. I elected to treat him medically at that time. In my view performed July 2008 showed mild inferior basal ischemia probably related to collateral insufficiency. He's remained asymptomatic.

## 2015-03-14 NOTE — Progress Notes (Signed)
03/14/2015 Clifford Sheppard   November 02, 1945  [id number]  Primary Physician Gennette Pac, MD Primary Cardiologist: Lorretta Harp MD Renae Gloss   HPI:   The patient is a very pleasant 69 year old, thin and fit appearing, married, Caucasian male, father of three, grandfather to three grandchildren, who I saw 12/11/13.. I catheterized him in May 1998 revealing a total RCA of left to right collaterals in the circumflex. The circumflex did have a 75% mid stenosis. I elected to treat him medically at that time. 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 It is completely asymptomatic. His lipid profile followed by his PCP which most recently was performed 02/13/15 revealing total cholesterol 143, LDL of 80 HDL of 39.. Since I saw him back in the office one year ago he denies chest pain or shortness of breath.   Current Outpatient Prescriptions  Medication Sig Dispense Refill  . aspirin EC 325 MG tablet Take 1 tablet (325 mg total) by mouth 2 (two) times daily after a meal. (Patient taking differently: Take 325 mg by mouth daily. ) 60 tablet 0  . atorvastatin (LIPITOR) 80 MG tablet Take 80 mg by mouth daily.    Marland Kitchen ibuprofen (ADVIL,MOTRIN) 800 MG tablet Take 800 mg by mouth every 8 (eight) hours as needed.    . metoprolol tartrate (LOPRESSOR) 25 MG tablet Take 0.5 tablets (12.5 mg total) by mouth 2 (two) times daily. 90 tablet 3  . Multiple Vitamin (MULTIVITAMIN) capsule Take 1 capsule by mouth daily.    . Omega-3 Fatty Acids (FISH OIL PO) Take 1,400 mg by mouth daily.    Marland Kitchen omeprazole (PRILOSEC) 20 MG capsule Take 20 mg by mouth daily as needed (for acid reflux).    Marland Kitchen oxyCODONE-acetaminophen (PERCOCET/ROXICET) 5-325 MG per tablet Take 1-2 tablets by mouth every 4 (four) hours as needed. 60 tablet 0  . REDNESS RELIEVER EYE DROPS 0.05 % ophthalmic solution Place 2 drops into both eyes as needed (for dry or red  eyes).     . sodium chloride (OCEAN) 0.65 % nasal spray Place 2-3 sprays into both nostrils as needed for congestion.      No current facility-administered medications for this visit.    No Known Allergies  Social History   Social History  . Marital Status: Married    Spouse Name: N/A  . Number of Children: N/A  . Years of Education: N/A   Occupational History  . Not on file.   Social History Main Topics  . Smoking status: Never Smoker   . Smokeless tobacco: Not on file  . Alcohol Use: Yes     Comment: OCC   . Drug Use: No  . Sexual Activity: Not on file   Other Topics Concern  . Not on file   Social History Narrative     Review of Systems: General: negative for chills, fever, night sweats or weight changes.  Cardiovascular: negative for chest pain, dyspnea on exertion, edema, orthopnea, palpitations, paroxysmal nocturnal dyspnea or shortness of breath Dermatological: negative for rash Respiratory: negative for cough or wheezing Urologic: negative for hematuria Abdominal: negative for nausea, vomiting, diarrhea, bright red blood per rectum, melena, or hematemesis Neurologic: negative for visual changes, syncope, or dizziness All other systems reviewed and are otherwise negative except as noted above.    Blood pressure 138/80, pulse 64, height 5\' 9"  (1.753 m), weight 177 lb (80.287 kg).  General appearance: alert and no distress  Neck: no adenopathy, no carotid bruit, no JVD, supple, symmetrical, trachea midline and thyroid not enlarged, symmetric, no tenderness/mass/nodules Lungs: clear to auscultation bilaterally Heart: regular rate and rhythm, S1, S2 normal, no murmur, click, rub or gallop Extremities: extremities normal, atraumatic, no cyanosis or edema  EKG normal sinus rhythm at 64 without ST or T-wave changes. I personally reviewed this EKG  ASSESSMENT AND PLAN:   HLD (hyperlipidemia) History of hyperlipidemia on atorvastatin with recent blood work  performed by his PCP 02/13/15 revealing total cholesterol 143, LDL of 80 and a steel of 39.  Coronary artery disease History of CAD status post catheterization by myself May 1998 revealing subtotal RCA with left right collaterals. The circumflex did have a 75% mid stenosis. I elected to treat him medically at that time. In my view performed July 2008 showed mild inferior basal ischemia probably related to collateral insufficiency. He's remained asymptomatic.      Lorretta Harp MD FACP,FACC,FAHA, [facility] 03/14/2015 9:40 AM

## 2015-03-14 NOTE — Patient Instructions (Signed)

## 2015-03-14 NOTE — Assessment & Plan Note (Signed)
History of hyperlipidemia on atorvastatin with recent blood work performed by his PCP 02/13/15 revealing total cholesterol 143, LDL of 80 and a steel of 39.

## 2015-03-21 DIAGNOSIS — D225 Melanocytic nevi of trunk: Secondary | ICD-10-CM | POA: Diagnosis not present

## 2015-03-21 DIAGNOSIS — H61009 Unspecified perichondritis of external ear, unspecified ear: Secondary | ICD-10-CM | POA: Diagnosis not present

## 2015-03-21 DIAGNOSIS — X32XXXD Exposure to sunlight, subsequent encounter: Secondary | ICD-10-CM | POA: Diagnosis not present

## 2015-03-21 DIAGNOSIS — L570 Actinic keratosis: Secondary | ICD-10-CM | POA: Diagnosis not present

## 2015-08-12 DIAGNOSIS — H5210 Myopia, unspecified eye: Secondary | ICD-10-CM | POA: Diagnosis not present

## 2015-08-12 DIAGNOSIS — H524 Presbyopia: Secondary | ICD-10-CM | POA: Diagnosis not present

## 2015-09-19 DIAGNOSIS — X32XXXD Exposure to sunlight, subsequent encounter: Secondary | ICD-10-CM | POA: Diagnosis not present

## 2015-09-19 DIAGNOSIS — L570 Actinic keratosis: Secondary | ICD-10-CM | POA: Diagnosis not present

## 2015-12-02 ENCOUNTER — Telehealth: Payer: Self-pay | Admitting: Cardiovascular Disease

## 2015-12-02 ENCOUNTER — Encounter: Payer: Self-pay | Admitting: Cardiovascular Disease

## 2015-12-02 NOTE — Telephone Encounter (Signed)
Closed Encounter  °

## 2015-12-16 ENCOUNTER — Ambulatory Visit: Payer: Commercial Managed Care - HMO | Admitting: Cardiovascular Disease

## 2015-12-24 ENCOUNTER — Encounter: Payer: Self-pay | Admitting: Cardiovascular Disease

## 2015-12-24 ENCOUNTER — Ambulatory Visit (INDEPENDENT_AMBULATORY_CARE_PROVIDER_SITE_OTHER): Payer: Commercial Managed Care - HMO | Admitting: Cardiovascular Disease

## 2015-12-24 VITALS — BP 130/78 | HR 67 | Ht 69.0 in | Wt 177.8 lb

## 2015-12-24 DIAGNOSIS — E785 Hyperlipidemia, unspecified: Secondary | ICD-10-CM | POA: Diagnosis not present

## 2015-12-24 DIAGNOSIS — I2510 Atherosclerotic heart disease of native coronary artery without angina pectoris: Secondary | ICD-10-CM

## 2015-12-24 DIAGNOSIS — I2583 Coronary atherosclerosis due to lipid rich plaque: Principal | ICD-10-CM

## 2015-12-24 NOTE — Progress Notes (Signed)
12/24/2015 Clifford Sheppard   09-05-1945  [id number]  Primary Physician Gennette Pac, MD Primary Cardiologist: Lorretta Harp MD [name], [place]  HPI:   The patient is a very pleasant 70 year old, thin and fit appearing, married, Caucasian male, father of three, grandfather to three grandchildren, who I saw 03/14/15.. I catheterized him in May 1998 revealing a total RCA of left to right collaterals in the circumflex. The circumflex did have a 75% mid stenosis. I elected to treat him medically at that time. 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 It is completely asymptomatic. His lipid profile followed by his PCP which most recently was performed 02/13/15 revealing total cholesterol 143, LDL of 80 HDL of 39.. Since I saw him back in the office one year ago he denies chest pain or shortness of breath.   Current Outpatient Prescriptions  Medication Sig Dispense Refill  . aspirin EC 325 MG tablet Take 1 tablet (325 mg total) by mouth 2 (two) times daily after a meal. (Patient taking differently: Take 325 mg by mouth once. ) 60 tablet 0  . atorvastatin (LIPITOR) 80 MG tablet Take 80 mg by mouth daily.    Marland Kitchen ibuprofen (ADVIL,MOTRIN) 800 MG tablet Take 800 mg by mouth every 8 (eight) hours as needed.    . metoprolol tartrate (LOPRESSOR) 25 MG tablet Take 0.5 tablets (12.5 mg total) by mouth 2 (two) times daily. 90 tablet 3  . Multiple Vitamin (MULTIVITAMIN) capsule Take 1 capsule by mouth daily.    . Omega-3 Fatty Acids (FISH OIL PO) Take 1,400 mg by mouth daily.    Marland Kitchen omeprazole (PRILOSEC) 20 MG capsule Take 20 mg by mouth daily as needed (for acid reflux).    . REDNESS RELIEVER EYE DROPS 0.05 % ophthalmic solution Place 2 drops into both eyes as needed (for dry or red eyes).     . sodium chloride (OCEAN) 0.65 % nasal spray Place 2-3 sprays into both nostrils as needed for congestion.      No current  facility-administered medications for this visit.     No Known Allergies  Social History   Social History  . Marital status: Married    Spouse name: N/A  . Number of children: N/A  . Years of education: N/A   Occupational History  . Not on file.   Social History Main Topics  . Smoking status: Never Smoker  . Smokeless tobacco: Not on file  . Alcohol use Yes     Comment: OCC   . Drug use: No  . Sexual activity: Not on file   Other Topics Concern  . Not on file   Social History Narrative  . No narrative on file     Review of Systems: General: negative for chills, fever, night sweats or weight changes.  Cardiovascular: negative for chest pain, dyspnea on exertion, edema, orthopnea, palpitations, paroxysmal nocturnal dyspnea or shortness of breath Dermatological: negative for rash Respiratory: negative for cough or wheezing Urologic: negative for hematuria Abdominal: negative for nausea, vomiting, diarrhea, bright red blood per rectum, melena, or hematemesis Neurologic: negative for visual changes, syncope, or dizziness All other systems reviewed and are otherwise negative except as noted above.    Blood pressure 130/78, pulse 67, height 5\' 9"  (1.753 m), weight 177 lb 12.8 oz (80.6 kg), SpO2 97 %.  General appearance: alert and no distress Neck: no adenopathy, no carotid bruit, no JVD, supple, symmetrical,  trachea midline and thyroid not enlarged, symmetric, no tenderness/mass/nodules Lungs: clear to auscultation bilaterally Heart: regular rate and rhythm, S1, S2 normal, no murmur, click, rub or gallop Extremities: extremities normal, atraumatic, no cyanosis or edema  EKG normal sinus rhythm at 67 without ST or T-wave changes. I personally reviewed this EKG  ASSESSMENT AND PLAN:   Coronary artery disease History of CAD status post cardiac catheterization performed by myself May 1998 revealing a total RCA with left-to-right collaterals from the circumflex is elected  to 75% mid stenosis. I elected to treat him medically at that time. A Myoview stress test performed 12/22/13  showed inferior ischemia probably from collateral insufficiency. He denies chest pain or shortness of breath.  HLD (hyperlipidemia) History of hyperlipidemia on statin therapy followed by his PCP      Lorretta Harp MD [facility], [facility] 12/24/2015 3:30 PM

## 2015-12-24 NOTE — Assessment & Plan Note (Signed)
History of hyperlipidemia on statin therapy followed by his PCP 

## 2015-12-24 NOTE — Assessment & Plan Note (Signed)
History of CAD status post cardiac catheterization performed by myself May 1998 revealing a total RCA with left-to-right collaterals from the circumflex is elected to 75% mid stenosis. I elected to treat him medically at that time. A Myoview stress test performed 12/22/13  showed inferior ischemia probably from collateral insufficiency. He denies chest pain or shortness of breath.

## 2015-12-24 NOTE — Patient Instructions (Signed)
Medication Instructions:  NO CHANGES.   Follow-Up: Your physician wants you to follow-up in: 12 MONTHS WITH DR BERRY.  You will receive a reminder letter in the mail two months in advance. If you don't receive a letter, please call our office to schedule the follow-up appointment.   If you need a refill on your cardiac medications before your next appointment, please call your pharmacy.   

## 2016-02-04 ENCOUNTER — Other Ambulatory Visit: Payer: Self-pay | Admitting: *Deleted

## 2016-02-04 ENCOUNTER — Telehealth: Payer: Self-pay | Admitting: Cardiovascular Disease

## 2016-02-04 MED ORDER — METOPROLOL TARTRATE 25 MG PO TABS: 25.0000 mg | ORAL_TABLET | Freq: Two times a day (BID) | ORAL | 1 refills | Status: DC

## 2016-02-04 NOTE — Telephone Encounter (Signed)
Patient forgot metoprolol at home, out of town at [facility]. Rx sent to pharmacy for pickup, pt aware and will call if further needs.

## 2016-02-04 NOTE — Telephone Encounter (Signed)
Called pt to verify needs - Metoprolol rx sent to pharmacy. No further concerns at this time.

## 2016-02-04 NOTE — Telephone Encounter (Signed)
New Message  Pt call requesting to speak with RN. Pt states he is out of town and does not have all of his meds. Pt would like to speak with RN about his missing meds. Please call back to discuss

## 2016-02-20 DIAGNOSIS — L570 Actinic keratosis: Secondary | ICD-10-CM | POA: Diagnosis not present

## 2016-02-20 DIAGNOSIS — Z125 Encounter for screening for malignant neoplasm of prostate: Secondary | ICD-10-CM | POA: Diagnosis not present

## 2016-02-20 DIAGNOSIS — R7301 Impaired fasting glucose: Secondary | ICD-10-CM | POA: Diagnosis not present

## 2016-02-20 DIAGNOSIS — H6120 Impacted cerumen, unspecified ear: Secondary | ICD-10-CM | POA: Diagnosis not present

## 2016-02-20 DIAGNOSIS — I10 Essential (primary) hypertension: Secondary | ICD-10-CM | POA: Diagnosis not present

## 2016-02-20 DIAGNOSIS — K210 Gastro-esophageal reflux disease with esophagitis: Secondary | ICD-10-CM | POA: Diagnosis not present

## 2016-02-20 DIAGNOSIS — Z0000 Encounter for general adult medical examination without abnormal findings: Secondary | ICD-10-CM | POA: Diagnosis not present

## 2016-02-20 DIAGNOSIS — I2510 Atherosclerotic heart disease of native coronary artery without angina pectoris: Secondary | ICD-10-CM | POA: Diagnosis not present

## 2016-02-20 DIAGNOSIS — E782 Mixed hyperlipidemia: Secondary | ICD-10-CM | POA: Diagnosis not present

## 2016-03-18 DIAGNOSIS — D120 Benign neoplasm of cecum: Secondary | ICD-10-CM | POA: Diagnosis not present

## 2016-03-18 DIAGNOSIS — K5730 Diverticulosis of large intestine without perforation or abscess without bleeding: Secondary | ICD-10-CM | POA: Diagnosis not present

## 2016-03-18 DIAGNOSIS — D126 Benign neoplasm of colon, unspecified: Secondary | ICD-10-CM | POA: Diagnosis not present

## 2016-03-18 DIAGNOSIS — K635 Polyp of colon: Secondary | ICD-10-CM | POA: Diagnosis not present

## 2016-03-18 DIAGNOSIS — Z86010 Personal history of colonic polyps: Secondary | ICD-10-CM | POA: Diagnosis not present

## 2016-03-18 DIAGNOSIS — D125 Benign neoplasm of sigmoid colon: Secondary | ICD-10-CM | POA: Diagnosis not present

## 2016-03-23 DIAGNOSIS — D126 Benign neoplasm of colon, unspecified: Secondary | ICD-10-CM | POA: Diagnosis not present

## 2016-03-23 DIAGNOSIS — K635 Polyp of colon: Secondary | ICD-10-CM | POA: Diagnosis not present

## 2016-04-22 ENCOUNTER — Other Ambulatory Visit: Payer: Self-pay | Admitting: Cardiovascular Disease

## 2016-04-23 NOTE — Telephone Encounter (Signed)
Rx has been sent to the pharmacy electronically. ° °

## 2016-06-03 DIAGNOSIS — M720 Palmar fascial fibromatosis [Dupuytren]: Secondary | ICD-10-CM | POA: Diagnosis not present

## 2016-08-31 DIAGNOSIS — H524 Presbyopia: Secondary | ICD-10-CM | POA: Diagnosis not present

## 2016-09-02 DIAGNOSIS — K210 Gastro-esophageal reflux disease with esophagitis: Secondary | ICD-10-CM | POA: Diagnosis not present

## 2016-11-29 ENCOUNTER — Other Ambulatory Visit: Payer: Self-pay | Admitting: Cardiovascular Disease

## 2016-12-22 DIAGNOSIS — Z0000 Encounter for general adult medical examination without abnormal findings: Secondary | ICD-10-CM | POA: Diagnosis not present

## 2016-12-22 DIAGNOSIS — I10 Essential (primary) hypertension: Secondary | ICD-10-CM | POA: Diagnosis not present

## 2016-12-22 DIAGNOSIS — R8290 Unspecified abnormal findings in urine: Secondary | ICD-10-CM | POA: Diagnosis not present

## 2016-12-22 DIAGNOSIS — E782 Mixed hyperlipidemia: Secondary | ICD-10-CM | POA: Diagnosis not present

## 2016-12-22 DIAGNOSIS — Z23 Encounter for immunization: Secondary | ICD-10-CM | POA: Diagnosis not present

## 2016-12-22 DIAGNOSIS — L570 Actinic keratosis: Secondary | ICD-10-CM | POA: Diagnosis not present

## 2016-12-22 DIAGNOSIS — Z125 Encounter for screening for malignant neoplasm of prostate: Secondary | ICD-10-CM | POA: Diagnosis not present

## 2016-12-22 DIAGNOSIS — H6120 Impacted cerumen, unspecified ear: Secondary | ICD-10-CM | POA: Diagnosis not present

## 2016-12-22 DIAGNOSIS — R7301 Impaired fasting glucose: Secondary | ICD-10-CM | POA: Diagnosis not present

## 2017-02-04 ENCOUNTER — Encounter: Payer: Self-pay | Admitting: Cardiovascular Disease

## 2017-02-04 ENCOUNTER — Ambulatory Visit (INDEPENDENT_AMBULATORY_CARE_PROVIDER_SITE_OTHER): Payer: Commercial Managed Care - HMO | Admitting: Cardiovascular Disease

## 2017-02-04 DIAGNOSIS — I2510 Atherosclerotic heart disease of native coronary artery without angina pectoris: Secondary | ICD-10-CM | POA: Diagnosis not present

## 2017-02-04 DIAGNOSIS — E7800 Pure hypercholesterolemia, unspecified: Secondary | ICD-10-CM

## 2017-02-04 NOTE — Assessment & Plan Note (Signed)
History of hyperlipidemia with a recent lipid profile performed by his PCP 12/22/16 revealing total cholesterol of 159, LDL 63, HDL of 32 and tragus or level of 319. He is on atorvastatin 80 mg a day.

## 2017-02-04 NOTE — Progress Notes (Signed)
02/04/2017 Clifford Sheppard   10-31-1945  [id number]  Primary Physician Hulan Fess, MD Primary Cardiologist: Lorretta Harp MD [name], [place]  HPI:  Clifford Sheppard is a 71 y.o. male thin and fit appearing, married, Caucasian male, father of three, grandfather to three grandchildren, who I saw 12/24/15.. I catheterized him in May 1998 revealing a total RCA of left to right collaterals in the circumflex. The circumflex did have a 75% mid stenosis. I elected to treat him medically at that time. 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 It is completely asymptomatic. His lipid profile followed by his PCP which most recently was performed 90/19/18 revealing total cholesterol 159, LDL of 63 and HDL of 32. His triglyceride level was 319. Since I saw him back in the office one year ago he denies chest pain or shortness of breath. He gets some atypical chest burning when he is recumbent at night.   Current Meds  Medication Sig  . aspirin EC 325 MG tablet Take 1 tablet (325 mg total) by mouth 2 (two) times daily after a meal. (Patient taking differently: Take 325 mg by mouth once. )  . atorvastatin (LIPITOR) 80 MG tablet Take 80 mg by mouth daily.  Marland Kitchen ibuprofen (ADVIL,MOTRIN) 800 MG tablet Take 800 mg by mouth every 8 (eight) hours as needed.  . metoprolol tartrate (LOPRESSOR) 25 MG tablet TAKE 1/2 TABLET TWICE DAILY  . Multiple Vitamin (MULTIVITAMIN) capsule Take 1 capsule by mouth daily.  . Omega-3 Fatty Acids (FISH OIL PO) Take 1,400 mg by mouth daily.  Marland Kitchen omeprazole (PRILOSEC) 20 MG capsule Take 20 mg by mouth daily as needed (for acid reflux).  . REDNESS RELIEVER EYE DROPS 0.05 % ophthalmic solution Place 2 drops into both eyes as needed (for dry or red eyes).   . sodium chloride (OCEAN) 0.65 % nasal spray Place 2-3 sprays into both nostrils as needed for congestion.   . [DISCONTINUED] metoprolol tartrate  (LOPRESSOR) 25 MG tablet Take 0.5 tablets (12.5 mg total) by mouth 2 (two) times daily.  . [DISCONTINUED] metoprolol tartrate (LOPRESSOR) 25 MG tablet Take 0.5 tablets (12.5 mg total) by mouth 2 (two) times daily. Please call to schedule appointment.     No Known Allergies  Social History   Social History  . Marital status: Married    Spouse name: N/A  . Number of children: N/A  . Years of education: N/A   Occupational History  . Not on file.   Social History Main Topics  . Smoking status: Never Smoker  . Smokeless tobacco: Never Used  . Alcohol use Yes     Comment: OCC   . Drug use: No  . Sexual activity: Not on file   Other Topics Concern  . Not on file   Social History Narrative  . No narrative on file     Review of Systems: General: negative for chills, fever, night sweats or weight changes.  Cardiovascular: negative for chest pain, dyspnea on exertion, edema, orthopnea, palpitations, paroxysmal nocturnal dyspnea or shortness of breath Dermatological: negative for rash Respiratory: negative for cough or wheezing Urologic: negative for hematuria Abdominal: negative for nausea, vomiting, diarrhea, bright red blood per rectum, melena, or hematemesis Neurologic: negative for visual changes, syncope, or dizziness All other systems reviewed and are otherwise negative except as noted above.    Blood pressure (!) 145/59, pulse 70, height 5\' 8"  (1.727 m), weight  178 lb 12.8 oz (81.1 kg).  General appearance: alert and no distress Neck: no adenopathy, no carotid bruit, no JVD, supple, symmetrical, trachea midline and thyroid not enlarged, symmetric, no tenderness/mass/nodules Lungs: clear to auscultation bilaterally Heart: regular rate and rhythm, S1, S2 normal, no murmur, click, rub or gallop Extremities: extremities normal, atraumatic, no cyanosis or edema Pulses: 2+ and symmetric Skin: Skin color, texture, turgor normal. No rashes or lesions  EKG sinus rhythm at 70  without ST or T-wave changes. I personally reviewed his EKG.  ASSESSMENT AND PLAN:   Coronary artery disease History of CAD status post cardiac catheterization by myself May 1998 revealed a total RCA with left-to-right collaterals. Circumflex did have a 75% mid stenosis. I elected to treat him medically. His last Myoview performed July 2008 showed mild inferior basilar ischemia probably related to collateral insufficiency although he is otherwise asymptomatic.  HLD (hyperlipidemia) History of hyperlipidemia with a recent lipid profile performed by his PCP 12/22/16 revealing total cholesterol of 159, LDL 63, HDL of 32 and tragus or level of 319. He is on atorvastatin 80 mg a day.      Lorretta Harp MD FACP,FACC,FAHA, FSCAI 02/04/2017 4:00 PM

## 2017-02-04 NOTE — Patient Instructions (Signed)

## 2017-02-04 NOTE — Assessment & Plan Note (Signed)
History of CAD status post cardiac catheterization by myself May 1998 revealed a total RCA with left-to-right collaterals. Circumflex did have a 75% mid stenosis. I elected to treat him medically. His last Myoview performed July 2008 showed mild inferior basilar ischemia probably related to collateral insufficiency although he is otherwise asymptomatic.

## 2017-03-22 DIAGNOSIS — M720 Palmar fascial fibromatosis [Dupuytren]: Secondary | ICD-10-CM | POA: Diagnosis not present

## 2017-03-24 DIAGNOSIS — M25641 Stiffness of right hand, not elsewhere classified: Secondary | ICD-10-CM | POA: Diagnosis not present

## 2017-03-24 DIAGNOSIS — M720 Palmar fascial fibromatosis [Dupuytren]: Secondary | ICD-10-CM | POA: Diagnosis not present

## 2017-04-07 DIAGNOSIS — M720 Palmar fascial fibromatosis [Dupuytren]: Secondary | ICD-10-CM | POA: Diagnosis not present

## 2017-04-07 DIAGNOSIS — M25641 Stiffness of right hand, not elsewhere classified: Secondary | ICD-10-CM | POA: Diagnosis not present

## 2017-04-15 DIAGNOSIS — M720 Palmar fascial fibromatosis [Dupuytren]: Secondary | ICD-10-CM | POA: Diagnosis not present

## 2017-04-15 DIAGNOSIS — M25641 Stiffness of right hand, not elsewhere classified: Secondary | ICD-10-CM | POA: Diagnosis not present

## 2017-04-21 DIAGNOSIS — M25641 Stiffness of right hand, not elsewhere classified: Secondary | ICD-10-CM | POA: Diagnosis not present

## 2017-04-21 DIAGNOSIS — M720 Palmar fascial fibromatosis [Dupuytren]: Secondary | ICD-10-CM | POA: Diagnosis not present

## 2017-04-28 DIAGNOSIS — M25641 Stiffness of right hand, not elsewhere classified: Secondary | ICD-10-CM | POA: Diagnosis not present

## 2017-04-28 DIAGNOSIS — M720 Palmar fascial fibromatosis [Dupuytren]: Secondary | ICD-10-CM | POA: Diagnosis not present

## 2017-05-12 DIAGNOSIS — M25641 Stiffness of right hand, not elsewhere classified: Secondary | ICD-10-CM | POA: Diagnosis not present

## 2017-05-12 DIAGNOSIS — M720 Palmar fascial fibromatosis [Dupuytren]: Secondary | ICD-10-CM | POA: Diagnosis not present

## 2017-05-30 DIAGNOSIS — M720 Palmar fascial fibromatosis [Dupuytren]: Secondary | ICD-10-CM | POA: Diagnosis not present

## 2017-06-20 ENCOUNTER — Other Ambulatory Visit: Payer: Self-pay | Admitting: Cardiovascular Disease

## 2017-06-27 DIAGNOSIS — M25641 Stiffness of right hand, not elsewhere classified: Secondary | ICD-10-CM | POA: Diagnosis not present

## 2017-06-27 DIAGNOSIS — M720 Palmar fascial fibromatosis [Dupuytren]: Secondary | ICD-10-CM | POA: Diagnosis not present

## 2017-08-12 DIAGNOSIS — R0781 Pleurodynia: Secondary | ICD-10-CM | POA: Diagnosis not present

## 2017-08-12 DIAGNOSIS — Y9239 Other specified sports and athletic area as the place of occurrence of the external cause: Secondary | ICD-10-CM | POA: Diagnosis not present

## 2017-08-12 DIAGNOSIS — S20211A Contusion of right front wall of thorax, initial encounter: Secondary | ICD-10-CM | POA: Diagnosis not present

## 2017-08-12 DIAGNOSIS — Y9367 Activity, basketball: Secondary | ICD-10-CM | POA: Diagnosis not present

## 2017-09-09 DIAGNOSIS — H524 Presbyopia: Secondary | ICD-10-CM | POA: Diagnosis not present

## 2017-12-02 DIAGNOSIS — Z23 Encounter for immunization: Secondary | ICD-10-CM | POA: Diagnosis not present

## 2018-01-18 DIAGNOSIS — Z1159 Encounter for screening for other viral diseases: Secondary | ICD-10-CM | POA: Diagnosis not present

## 2018-01-18 DIAGNOSIS — Z1389 Encounter for screening for other disorder: Secondary | ICD-10-CM | POA: Diagnosis not present

## 2018-01-18 DIAGNOSIS — M720 Palmar fascial fibromatosis [Dupuytren]: Secondary | ICD-10-CM | POA: Diagnosis not present

## 2018-01-18 DIAGNOSIS — K219 Gastro-esophageal reflux disease without esophagitis: Secondary | ICD-10-CM | POA: Diagnosis not present

## 2018-01-18 DIAGNOSIS — Z86010 Personal history of colonic polyps: Secondary | ICD-10-CM | POA: Diagnosis not present

## 2018-01-18 DIAGNOSIS — R7301 Impaired fasting glucose: Secondary | ICD-10-CM | POA: Diagnosis not present

## 2018-01-18 DIAGNOSIS — Z0000 Encounter for general adult medical examination without abnormal findings: Secondary | ICD-10-CM | POA: Diagnosis not present

## 2018-01-18 DIAGNOSIS — I2510 Atherosclerotic heart disease of native coronary artery without angina pectoris: Secondary | ICD-10-CM | POA: Diagnosis not present

## 2018-01-18 DIAGNOSIS — L570 Actinic keratosis: Secondary | ICD-10-CM | POA: Diagnosis not present

## 2018-01-18 DIAGNOSIS — I10 Essential (primary) hypertension: Secondary | ICD-10-CM | POA: Diagnosis not present

## 2018-01-18 DIAGNOSIS — E782 Mixed hyperlipidemia: Secondary | ICD-10-CM | POA: Diagnosis not present

## 2018-02-03 DIAGNOSIS — D225 Melanocytic nevi of trunk: Secondary | ICD-10-CM | POA: Diagnosis not present

## 2018-02-03 DIAGNOSIS — X32XXXD Exposure to sunlight, subsequent encounter: Secondary | ICD-10-CM | POA: Diagnosis not present

## 2018-02-03 DIAGNOSIS — L570 Actinic keratosis: Secondary | ICD-10-CM | POA: Diagnosis not present

## 2018-03-11 DIAGNOSIS — J0100 Acute maxillary sinusitis, unspecified: Secondary | ICD-10-CM | POA: Diagnosis not present

## 2018-03-21 ENCOUNTER — Other Ambulatory Visit: Payer: Self-pay | Admitting: Cardiovascular Disease

## 2018-03-22 ENCOUNTER — Ambulatory Visit: Payer: Commercial Managed Care - HMO | Admitting: Cardiovascular Disease

## 2018-03-22 ENCOUNTER — Encounter: Payer: Self-pay | Admitting: Cardiovascular Disease

## 2018-03-22 VITALS — BP 136/84 | HR 64 | Ht 68.0 in | Wt 180.0 lb

## 2018-03-22 DIAGNOSIS — E7800 Pure hypercholesterolemia, unspecified: Secondary | ICD-10-CM

## 2018-03-22 DIAGNOSIS — I2510 Atherosclerotic heart disease of native coronary artery without angina pectoris: Secondary | ICD-10-CM

## 2018-03-22 MED ORDER — ASPIRIN 81 MG PO TABS: 81.0000 mg | ORAL_TABLET | Freq: Every day | ORAL | 3 refills | Status: DC

## 2018-03-22 MED ORDER — ASPIRIN 81 MG PO TABS: 325.0000 mg | ORAL_TABLET | Freq: Every day | ORAL | 3 refills | Status: DC

## 2018-03-22 NOTE — Progress Notes (Signed)
03/22/2018 [name]   05-12-1945  [id number]  Primary Physician Hulan Fess, MD Primary Cardiologist: Lorretta Harp MD [name], [place]  HPI:  Clifford Sheppard is a 72 y.o.  thin and fit appearing, married, Caucasian male, father of three, grandfather to three grandchildren, who I saw  02/04/2017.. I catheterized him in May 1998 revealing a total RCA of left to right collaterals in the circumflex. The circumflex did have a 75% mid stenosis. I elected to treat him medically at that time. 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 It is completely asymptomatic. His lipid profile followed by his PCP which most recently was performed 90/19/18 revealing total cholesterol 159, LDL of 63 and HDL of 32. His triglyceride level was 319. Since I saw him back in the office one year ago he denies chest pain or shortness of breath.  Current Meds  Medication Sig  . aspirin 81 MG tablet Take 4 tablets (325 mg total) by mouth daily.  Marland Kitchen atorvastatin (LIPITOR) 80 MG tablet Take 80 mg by mouth daily.  Marland Kitchen ibuprofen (ADVIL,MOTRIN) 800 MG tablet Take 800 mg by mouth every 8 (eight) hours as needed.  . metoprolol tartrate (LOPRESSOR) 25 MG tablet TAKE 1/2 TABLET TWICE DAILY  . Multiple Vitamin (MULTIVITAMIN) capsule Take 1 capsule by mouth daily.  . Omega-3 Fatty Acids (FISH OIL PO) Take 1,400 mg by mouth daily.  Marland Kitchen omeprazole (PRILOSEC) 20 MG capsule Take 20 mg by mouth daily as needed (for acid reflux).  . REDNESS RELIEVER EYE DROPS 0.05 % ophthalmic solution Place 2 drops into both eyes as needed (for dry or red eyes).   . sodium chloride (OCEAN) 0.65 % nasal spray Place 2-3 sprays into both nostrils as needed for congestion.   . [DISCONTINUED] aspirin 325 MG tablet Take 325 mg by mouth daily.     No Known Allergies  Social History   Socioeconomic History  . Marital status: Married    Spouse name: Not on file    . Number of children: Not on file  . Years of education: Not on file  . Highest education level: Not on file  Occupational History  . Not on file  Social Needs  . Financial resource strain: Not on file  . Food insecurity:    Worry: Not on file    Inability: Not on file  . Transportation needs:    Medical: Not on file    Non-medical: Not on file  Tobacco Use  . Smoking status: Never Smoker  . Smokeless tobacco: Never Used  Substance and Sexual Activity  . Alcohol use: Yes    Comment: OCC   . Drug use: No  . Sexual activity: Not on file  Lifestyle  . Physical activity:    Days per week: Not on file    Minutes per session: Not on file  . Stress: Not on file  Relationships  . Social connections:    Talks on phone: Not on file    Gets together: Not on file    Attends religious service: Not on file    Active member of club or organization: Not on file    Attends meetings of clubs or organizations: Not on file    Relationship status: Not on file  . Intimate partner violence:    Fear of current or ex partner: Not on file    Emotionally abused: Not on file  Physically abused: Not on file    Forced sexual activity: Not on file  Other Topics Concern  . Not on file  Social History Narrative  . Not on file     Review of Systems: General: negative for chills, fever, night sweats or weight changes.  Cardiovascular: negative for chest pain, dyspnea on exertion, edema, orthopnea, palpitations, paroxysmal nocturnal dyspnea or shortness of breath Dermatological: negative for rash Respiratory: negative for cough or wheezing Urologic: negative for hematuria Abdominal: negative for nausea, vomiting, diarrhea, bright red blood per rectum, melena, or hematemesis Neurologic: negative for visual changes, syncope, or dizziness All other systems reviewed and are otherwise negative except as noted above.    Blood pressure 136/84, pulse 64, height 5\' 8"  (1.727 m), weight 180 lb (81.6  kg).  General appearance: alert and no distress Neck: no adenopathy, no carotid bruit, no JVD, supple, symmetrical, trachea midline and thyroid not enlarged, symmetric, no tenderness/mass/nodules Lungs: clear to auscultation bilaterally Heart: regular rate and rhythm, S1, S2 normal, no murmur, click, rub or gallop Extremities: extremities normal, atraumatic, no cyanosis or edema Pulses: 2+ and symmetric Skin: Skin color, texture, turgor normal. No rashes or lesions Neurologic: Alert and oriented X 3, normal strength and tone. Normal symmetric reflexes. Normal coordination and gait  EKG sinus rhythm at 64 without ST or T wave changes.  I personally reviewed this EKG.  ASSESSMENT AND PLAN:   Coronary artery disease History of CAD status post cardiac catheterization which I performed May 1998 revealing a total RCA with left-to-right collaterals.  Circumflex did have a 75% mid stenosis which I elected to treat medically.  Myoview performed July 2008 showed mild inferobasal ischemia probably related to collateral insufficiency.  He is totally asymptomatic.  HLD (hyperlipidemia) History of hyperlipidemia on high-dose statin therapy with recent lipid profile performed 01/18/2018 revealing total cholesterol 236, LDL 153 and HDL of 36.  This compares to his lipid profile performed 1 year ago when his total cholesterol is 159, LDL was 63 and HDL was 32 The same dose of Lipitor.  He says he was on cruise ship prior to his lab work which may explain this changes.  He scheduled for recheck in 3 months.      Lorretta Harp MD FACP,FACC,FAHA, [facility] 03/22/2018 3:58 PM

## 2018-03-22 NOTE — Patient Instructions (Addendum)
Medication Instructions:  START ASPIRIN 81 MG BY MOUTH ONCE DAILY  If you need a refill on your cardiac medications before your next appointment, please call your pharmacy.   Lab work: NONE If you have labs (blood work) drawn today and your tests are completely normal, you will receive your results only by: Marland Kitchen MyChart Message (if you have MyChart) OR . A paper copy in the mail If you have any lab test that is abnormal or we need to change your treatment, we will call you to review the results.  Testing/Procedures: NONE  Follow-Up: At [facility], you and your health needs are our priority.  As part of our continuing mission to provide you with exceptional heart care, we have created designated Provider Care Teams.  These Care Teams include your primary Cardiologist (physician) and Advanced Practice Providers (APPs -  Physician Assistants and Nurse Practitioners) who all work together to provide you with the care you need, when you need it. You will need a follow up appointment in 12 months.  Please call our office 2 months in advance to schedule this appointment.  You may see DR. BERRY or one of the following Advanced Practice Providers on your designated Care Team:   Kerin Ransom, PA-C [place], [place] . Sande Rives, PA-C

## 2018-03-22 NOTE — Assessment & Plan Note (Signed)
History of CAD status post cardiac catheterization which I performed May 1998 revealing a total RCA with left-to-right collaterals.  Circumflex did have a 75% mid stenosis which I elected to treat medically.  Myoview performed July 2008 showed mild inferobasal ischemia probably related to collateral insufficiency.  He is totally asymptomatic.

## 2018-03-22 NOTE — Assessment & Plan Note (Signed)
History of hyperlipidemia on high-dose statin therapy with recent lipid profile performed 01/18/2018 revealing total cholesterol 236, LDL 153 and HDL of 36.  This compares to his lipid profile performed 1 year ago when his total cholesterol is 159, LDL was 63 and HDL was 32 The same dose of Lipitor.  He says he was on cruise ship prior to his lab work which may explain this changes.  He scheduled for recheck in 3 months.

## 2018-04-07 DIAGNOSIS — M542 Cervicalgia: Secondary | ICD-10-CM | POA: Diagnosis not present

## 2018-04-19 DIAGNOSIS — M542 Cervicalgia: Secondary | ICD-10-CM | POA: Diagnosis not present

## 2018-04-19 DIAGNOSIS — R51 Headache: Secondary | ICD-10-CM | POA: Diagnosis not present

## 2018-05-04 ENCOUNTER — Other Ambulatory Visit: Payer: Self-pay | Admitting: Family Medicine

## 2018-05-04 DIAGNOSIS — R519 Headache, unspecified: Secondary | ICD-10-CM

## 2018-05-04 DIAGNOSIS — R51 Headache: Principal | ICD-10-CM

## 2018-05-10 ENCOUNTER — Ambulatory Visit
Admission: RE | Admit: 2018-05-10 | Discharge: 2018-05-10 | Disposition: A | Discharge status: Home / Self Care | Payer: Commercial Managed Care - HMO | Source: Ambulatory Visit | Attending: Family Medicine | Admitting: Family Medicine

## 2018-05-10 DIAGNOSIS — R519 Headache, unspecified: Secondary | ICD-10-CM

## 2018-05-10 DIAGNOSIS — R51 Headache: Principal | ICD-10-CM

## 2018-05-10 MED ORDER — IOPAMIDOL (ISOVUE-300) INJECTION 61%
75.0000 mL | Freq: Once | INTRAVENOUS | Status: AC | PRN
  Administered 2018-05-10: 75 mL via INTRAVENOUS

## 2018-06-09 DIAGNOSIS — Z049 Encounter for examination and observation for unspecified reason: Secondary | ICD-10-CM | POA: Diagnosis not present

## 2018-06-09 DIAGNOSIS — Z79899 Other long term (current) drug therapy: Secondary | ICD-10-CM | POA: Diagnosis not present

## 2018-06-09 DIAGNOSIS — R51 Headache: Secondary | ICD-10-CM | POA: Diagnosis not present

## 2018-06-14 ENCOUNTER — Other Ambulatory Visit: Payer: Self-pay | Admitting: Specialist

## 2018-06-14 DIAGNOSIS — G4489 Other headache syndrome: Secondary | ICD-10-CM

## 2018-06-24 ENCOUNTER — Other Ambulatory Visit: Payer: Commercial Managed Care - HMO

## 2018-06-28 ENCOUNTER — Other Ambulatory Visit: Payer: Medicare HMO

## 2018-06-28 ENCOUNTER — Ambulatory Visit
Admission: RE | Admit: 2018-06-28 | Discharge: 2018-06-28 | Disposition: A | Discharge status: Home / Self Care | Payer: Medicare HMO | Source: Ambulatory Visit | Attending: Specialist | Admitting: Specialist

## 2018-06-28 DIAGNOSIS — G4489 Other headache syndrome: Secondary | ICD-10-CM

## 2018-06-28 DIAGNOSIS — R51 Headache: Secondary | ICD-10-CM | POA: Diagnosis not present

## 2018-06-28 MED ORDER — GADOBENATE DIMEGLUMINE 529 MG/ML IV SOLN
16.0000 mL | Freq: Once | INTRAVENOUS | Status: AC | PRN
  Administered 2018-06-28: 16 mL via INTRAVENOUS

## 2018-06-30 ENCOUNTER — Other Ambulatory Visit: Payer: Medicare HMO

## 2018-07-01 ENCOUNTER — Other Ambulatory Visit: Payer: Medicare HMO

## 2018-07-10 DIAGNOSIS — R51 Headache: Secondary | ICD-10-CM | POA: Diagnosis not present

## 2018-07-26 ENCOUNTER — Other Ambulatory Visit: Payer: Self-pay | Admitting: Cardiovascular Disease

## 2018-07-26 NOTE — Telephone Encounter (Signed)
°*  STAT* If patient is at the pharmacy, call can be transferred to refill team.   1. Which medications need to be refilled? (please list name of each medication and dose if known)  metoprolol tartrate (LOPRESSOR) 25 MG tablet  2. Which pharmacy/location (including street and city if local pharmacy) is medication to be sent to? [place], [place]  3. Do they need a 30 day or 90 day supply? 90   Pt only has about 10 days of medication left

## 2018-07-26 NOTE — Telephone Encounter (Signed)
Lopressor 25 mg refilled 

## 2018-07-31 DIAGNOSIS — M542 Cervicalgia: Secondary | ICD-10-CM | POA: Diagnosis not present

## 2018-07-31 DIAGNOSIS — M7918 Myalgia, other site: Secondary | ICD-10-CM | POA: Diagnosis not present

## 2018-07-31 DIAGNOSIS — R51 Headache: Secondary | ICD-10-CM | POA: Diagnosis not present

## 2018-08-03 DIAGNOSIS — R51 Headache: Secondary | ICD-10-CM | POA: Diagnosis not present

## 2018-08-03 DIAGNOSIS — M542 Cervicalgia: Secondary | ICD-10-CM | POA: Diagnosis not present

## 2018-08-03 DIAGNOSIS — M7918 Myalgia, other site: Secondary | ICD-10-CM | POA: Diagnosis not present

## 2018-08-07 DIAGNOSIS — L570 Actinic keratosis: Secondary | ICD-10-CM | POA: Diagnosis not present

## 2018-08-07 DIAGNOSIS — X32XXXD Exposure to sunlight, subsequent encounter: Secondary | ICD-10-CM | POA: Diagnosis not present

## 2018-08-08 DIAGNOSIS — R51 Headache: Secondary | ICD-10-CM | POA: Diagnosis not present

## 2018-08-08 DIAGNOSIS — M542 Cervicalgia: Secondary | ICD-10-CM | POA: Diagnosis not present

## 2018-08-08 DIAGNOSIS — M7918 Myalgia, other site: Secondary | ICD-10-CM | POA: Diagnosis not present

## 2018-08-10 DIAGNOSIS — M542 Cervicalgia: Secondary | ICD-10-CM | POA: Diagnosis not present

## 2018-08-10 DIAGNOSIS — M7918 Myalgia, other site: Secondary | ICD-10-CM | POA: Diagnosis not present

## 2018-08-10 DIAGNOSIS — R51 Headache: Secondary | ICD-10-CM | POA: Diagnosis not present

## 2018-08-15 DIAGNOSIS — R51 Headache: Secondary | ICD-10-CM | POA: Diagnosis not present

## 2018-08-15 DIAGNOSIS — M542 Cervicalgia: Secondary | ICD-10-CM | POA: Diagnosis not present

## 2018-08-15 DIAGNOSIS — M7918 Myalgia, other site: Secondary | ICD-10-CM | POA: Diagnosis not present

## 2018-08-17 DIAGNOSIS — R51 Headache: Secondary | ICD-10-CM | POA: Diagnosis not present

## 2018-08-17 DIAGNOSIS — M542 Cervicalgia: Secondary | ICD-10-CM | POA: Diagnosis not present

## 2018-08-17 DIAGNOSIS — M7918 Myalgia, other site: Secondary | ICD-10-CM | POA: Diagnosis not present

## 2018-08-22 DIAGNOSIS — M542 Cervicalgia: Secondary | ICD-10-CM | POA: Diagnosis not present

## 2018-08-22 DIAGNOSIS — R51 Headache: Secondary | ICD-10-CM | POA: Diagnosis not present

## 2018-08-22 DIAGNOSIS — M7918 Myalgia, other site: Secondary | ICD-10-CM | POA: Diagnosis not present

## 2018-08-24 DIAGNOSIS — R51 Headache: Secondary | ICD-10-CM | POA: Diagnosis not present

## 2018-08-24 DIAGNOSIS — M7918 Myalgia, other site: Secondary | ICD-10-CM | POA: Diagnosis not present

## 2018-08-24 DIAGNOSIS — M542 Cervicalgia: Secondary | ICD-10-CM | POA: Diagnosis not present

## 2018-08-31 DIAGNOSIS — R51 Headache: Secondary | ICD-10-CM | POA: Diagnosis not present

## 2018-09-20 DIAGNOSIS — H524 Presbyopia: Secondary | ICD-10-CM | POA: Diagnosis not present

## 2018-10-05 DIAGNOSIS — R51 Headache: Secondary | ICD-10-CM | POA: Diagnosis not present

## 2018-11-27 ENCOUNTER — Ambulatory Visit (INDEPENDENT_AMBULATORY_CARE_PROVIDER_SITE_OTHER): Payer: Medicare HMO | Admitting: Neurology

## 2018-11-27 ENCOUNTER — Other Ambulatory Visit: Payer: Self-pay

## 2018-11-27 ENCOUNTER — Encounter: Payer: Self-pay | Admitting: Neurology

## 2018-11-27 VITALS — BP 143/91 | HR 67 | Temp 97.8°F | Wt 174.0 lb

## 2018-11-27 DIAGNOSIS — R400 Somnolence: Secondary | ICD-10-CM

## 2018-11-27 DIAGNOSIS — G44229 Chronic tension-type headache, not intractable: Secondary | ICD-10-CM

## 2018-11-27 DIAGNOSIS — R413 Other amnesia: Secondary | ICD-10-CM

## 2018-11-27 DIAGNOSIS — R0683 Snoring: Secondary | ICD-10-CM | POA: Diagnosis not present

## 2018-11-27 DIAGNOSIS — G3184 Mild cognitive impairment, so stated: Secondary | ICD-10-CM

## 2018-11-27 MED ORDER — DIVALPROEX SODIUM ER 500 MG PO TB24: 500.0000 mg | ORAL_TABLET | Freq: Every day | ORAL | 2 refills | Status: DC

## 2018-11-27 NOTE — Progress Notes (Signed)
[facility] [address] [place]. [place] [address] [phone]       OFFICE CONSULT NOTE  Clifford Sheppard Date of Birth:  February 23, 1946 Medical Record Number:  [id number]   Referring MD: Clifford Sheppard  Reason for Referral: Headaches  HPI: Clifford Sheppard he is a 73 year old Caucasian male who developed new onset daily headaches since January 2020.  He describes the headache starting with initially some congestion of his sinuses and he was saw his primary care physician who treated him with a course of steroids for a week without relief.  The headaches were initially intermittent mostly increased with coughing and sneezing.  Headaches that time is posteriorly involving the back of the head and neck occasionally it is behind the eyes.  The headaches are mostly mild 3-4/10 in severity mostly he can get by without medications occasionally has tried taking Tylenol or ibuprofen with partial relief only.  He was seen by Dr. Domingo Sheppard from [facility] and unfortunately I do not have records from there to review today.  He apparently had a CT scan of the head on 05/10/2018 which was normal.  Is also subsequently had an MRI scan of the brain with and without contrast on 06/28/2018 which showed only mild age-appropriate changes of small vessel disease without acute abnormality.  Patient does complain of snoring at night as well as a daytime sleepiness despite sleeping 7 to 8 hours every night.  He denies being under significant stress.  He does play golf regularly but does not exercise regularly.  He denies any slurred speech, vertigo, focal extremity weakness, gait or balance difficulties.  There is no prior history of strokes TIAs seizures significant head injury or loss of consciousness.  He does complain of some occasional vision difficulties with his headaches but denies true loss of vision or diminished vision acuity.  He had ESR checked by primary physician which was normal.  He  states that he has cataracts in both eyes and plans to see an eye doctor to consider surgery soon.  The patient's wife has noticed that he is having some short-term memory difficulties as well in the last couple of months.  Patient feels he still independent and able to do most activities for himself though he is had to write things down in order to not forget.  There is no family history of Alzheimer's or dementia. ROS:   14 system review of systems is positive for headache, neck pain, muscle tightness, memory loss, vision difficulties, snoring, daytime tiredness and all other systems negative  PMH:  Past Medical History:  Diagnosis Date   Arthritis    CAD (coronary artery disease)    GERD (gastroesophageal reflux disease)    H/O hiatal hernia    HLD (hyperlipidemia)     Social History:  Social History   Socioeconomic History   Marital status: Married    Spouse name: Not on file   Number of children: Not on file   Years of education: Not on file   Highest education level: Not on file  Occupational History   Not on file  Social Needs   Financial resource strain: Not on file   Food insecurity    Worry: Not on file    Inability: Not on file   Transportation needs    Medical: Not on file    Non-medical: Not on file  Tobacco Use   Smoking status: Never Smoker   Smokeless tobacco: Never Used  Substance and  Sexual Activity   Alcohol use: Yes    Comment: OCC    Drug use: No   Sexual activity: Not on file  Lifestyle   Physical activity    Days per week: Not on file    Minutes per session: Not on file   Stress: Not on file  Relationships   Social connections    Talks on phone: Not on file    Gets together: Not on file    Attends religious service: Not on file    Active member of club or organization: Not on file    Attends meetings of clubs or organizations: Not on file    Relationship status: Not on file   Intimate partner violence    Fear of  current or ex partner: Not on file    Emotionally abused: Not on file    Physically abused: Not on file    Forced sexual activity: Not on file  Other Topics Concern   Not on file  Social History Narrative   Not on file    Medications:   Current Outpatient Medications on File Prior to Visit  Medication Sig Dispense Refill   aspirin 81 MG chewable tablet 1 tablet Orally Once a day     atorvastatin (LIPITOR) 80 MG tablet Take 80 mg by mouth daily.     ibuprofen (ADVIL) 800 MG tablet 1 tablet Orally Three times a day prn pain for 30 days     metoprolol tartrate (LOPRESSOR) 25 MG tablet TAKE 1/2 TABLET TWICE DAILY 90 tablet 2   Multiple Vitamin (MULTIVITAMIN) capsule Take 1 capsule by mouth daily.     Omega-3 Fatty Acids (FISH OIL PO) Take 1,400 mg by mouth daily.     omeprazole (PRILOSEC) 20 MG capsule Take 20 mg by mouth daily as needed (for acid reflux).     REDNESS RELIEVER EYE DROPS 0.05 % ophthalmic solution Place 2 drops into both eyes as needed (for dry or red eyes).      sodium chloride (OCEAN) 0.65 % nasal spray Place 2-3 sprays into both nostrils as needed for congestion.      No current facility-administered medications on file prior to visit.     Allergies:  No Known Allergies  Physical Exam General: well developed, well nourished elderly Caucasian male, seated, in no evident distress Head: head normocephalic and atraumatic.   Neck: supple with no carotid or supraclavicular bruits Cardiovascular: regular rate and rhythm, no murmurs Musculoskeletal: no deformity Skin:  no rash/petichiae Vascular:  Normal pulses all extremities  Neurologic Exam Mental Status: Awake and fully alert. Oriented to place and time. Recent and remote memory intact. Attention span, concentration and fund of knowledge appropriate. Mood and affect appropriate.  Diminished recall 2/3.  Able to name only 5 animals which can walk on 4 legs. Cranial Nerves: Fundoscopic exam reveals sharp  disc margins. Pupils equal, briskly reactive to light. Extraocular movements full without nystagmus. Visual fields full to confrontation. Hearing intact. Facial sensation intact. Face, tongue, palate moves normally and symmetrically.  Motor: Normal bulk and tone. Normal strength in all tested extremity muscles. Sensory.: intact to touch , pinprick , position and vibratory sensation.  Coordination: Rapid alternating movements normal in all extremities. Finger-to-nose and heel-to-shin performed accurately bilaterally. Gait and Station: Arises from chair without difficulty. Stance is normal. Gait demonstrates normal stride length and balance . Able to heel, toe and tandem walk without difficulty.  Reflexes: 1+ and symmetric. Toes downgoing.  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     PLAN: I had a long discussion with the patient and his wife regarding his chronic headaches which sound like chronic tension headaches.  He is also has some memory difficulties which may be mild cognitive impairment.  He also has symptoms which put him at risk for sleep apnea which needs to be evaluated.  I recommend trial of Depakote ER 500 mg daily for his daily headaches as well as increase participation in activities for stress relaxation like meditation yoga and exercises.  I also recommend he do regular neck stretching exercises.  Check lab work for reversible causes of memory loss and MRI scan of the brain with and without contrast.  I have also advised him to increase participation in cognitively challenging activities like solving crossword puzzles, playing bridge and sudoku.  Greater than 50% time during this 45-minute consultation visit was spent on counseling and coordination of care about his daily headaches as well as memory loss and mild  cognitive impairment and answering questions.  He will return for follow-up in 2 months or call earlier if necessary. Antony Contras, MD  [facility] [address] [place], [place] [address]  Phone [phone] Fax [phone] Note: This document was prepared with digital dictation and possible smart phrase technology. Any transcriptional errors that result from this process are unintentional.

## 2018-11-27 NOTE — Patient Instructions (Signed)
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  He is also has some memory difficulties which may be mild cognitive impairment.  He also has symptoms which put him at risk for sleep apnea which needs to be evaluated.  I recommend trial of Depakote ER 500 mg daily for his daily headaches as well as increase participation in activities for stress relaxation like meditation yoga and exercises.  I also recommend he do regular neck stretching exercises.  Check lab work for reversible causes of memory loss and MRI scan of the brain with and without contrast.  I have also advised him to increase participation in cognitively challenging activities like solving crossword puzzles, playing bridge and sudoku.  He will return for follow-up in 2 months or call earlier if necessary.   Tension Headache, Adult A tension headache is a feeling of pain, pressure, or aching in the head that is often felt over the front and sides of the head. The pain can be dull, or it can feel tight (constricting). There are two types of tension headache:  Episodic tension headache. This is when the headaches happen fewer than 15 days a month.  Chronic tension headache. This is when the headaches happen more than 15 days a month during a 54-month period. A tension headache can last from 30 minutes to several days. It is the most common kind of headache. Tension headaches are not normally associated with nausea or vomiting, and they do not get worse with physical activity. What are the causes? The exact cause of this condition is not known. Tension headaches are often triggered by stress, anxiety, or depression. Other triggers include:  Alcohol.  Too much caffeine or caffeine withdrawal.  Respiratory infections, such as colds, flu, or sinus infections.  Dental problems or teeth clenching.  Tiredness (fatigue).  Holding your head and neck in the same  position for a long period of time, such as while using a computer.  Smoking.  Arthritis of the neck. What are the signs or symptoms? Symptoms of this condition include:  A feeling of pressure or tightness around the head.  Dull, aching head pain.  Pain over the front and sides of the head.  Tenderness in the muscles of the head, neck, and shoulders. How is this diagnosed? This condition may be diagnosed based on your symptoms, your medical history, and a physical exam. If your symptoms are severe or unusual, you may have imaging tests, such as a CT scan or an MRI of your head. Your vision may also be checked. How is this treated? This condition may be treated with lifestyle changes and with medicines that help relieve symptoms. Follow these instructions at home: Managing pain  Take over-the-counter and prescription medicines only as told by your health care provider.  When you have a headache, lie down in a dark, quiet room.  If directed, apply ice to the head and neck: ? Put ice in a plastic bag. ? Place a towel between your skin and the bag. ? Leave the ice on for 20 minutes, 2-3 times a day.  If directed, apply heat to the back of your neck as often as told by your health care provider. Use the heat source that your health care provider recommends, such as a moist heat pack or a heating pad. ? Place a towel between your skin and the heat source. ? Leave the heat on for 20-30  minutes. ? Remove the heat if your skin turns bright red. This is especially important if you are unable to feel pain, heat, or cold. You may have a greater risk of getting burned. Eating and drinking  Eat meals on a regular schedule.  Limit alcohol intake to no more than 1 drink a day for nonpregnant women and 2 drinks a day for men. One drink equals 12 oz of beer, 5 oz of wine, or 1 oz of hard liquor.  Drink enough fluid to keep your urine pale yellow.  Decrease your caffeine intake, or stop  using caffeine. Lifestyle  Get 7-9 hours of sleep each night, or get the amount of sleep recommended by your health care provider.  At bedtime, remove all electronic devices from your room. Electronic devices include computers, phones, and tablets.  Find ways to manage your stress. Some things that can help relieve stress include: ? Exercise. ? Deep breathing exercises. ? Yoga. ? Listening to music. ? Positive mental imagery.  Try to sit up straight and avoid tensing your muscles.  Do not use any products that contain nicotine or tobacco, such as cigarettes and e-cigarettes. If you need help quitting, ask your health care provider. General instructions   Keep all follow-up visits as told by your health care provider. This is important.  Avoid any headache triggers. Keep a headache journal to help find out what may trigger your headaches. For example, write down: ? What you eat and drink. ? How much sleep you get. ? Any change to your diet or medicines. Contact a health care provider if:  Your headache does not get better.  Your headache comes back.  You are sensitive to sounds, light, or smells because of a headache.  You have nausea or you vomit.  Your stomach hurts. Get help right away if:  You suddenly develop a very severe headache along with any of the following: ? A stiff neck. ? Nausea and vomiting. ? Confusion. ? Weakness. ? Double vision or loss of vision. ? Shortness of breath. ? Rash. ? Unusual sleepiness. ? Fever. ? Trouble speaking. ? Pain in your eyes or ears. ? Trouble walking or balancing. ? Feeling faint or passing out. Summary  A tension headache is a feeling of pain, pressure, or aching in the head that is often felt over the front and sides of the head.  A tension headache can last from 30 minutes to several days. It is the most common kind of headache.  This condition may be diagnosed based on your symptoms, your medical history, and a  physical exam.  This condition may be treated with lifestyle changes and with medicines that help relieve symptoms. This information is not intended to replace advice given to you by your health care provider. Make sure you discuss any questions you have with your health care provider. Document Released: 03/22/2005 Document Revised: 03/04/2017 Document Reviewed: 07/02/2016 Elsevier Patient Education  2020 [employer].

## 2018-11-28 ENCOUNTER — Telehealth: Payer: Self-pay | Admitting: Neurology

## 2018-11-28 LAB — DEMENTIA PANEL
Homocysteine: 10.4 umol/L (ref 0.0–19.2)
RPR Ser Ql: NONREACTIVE
TSH: 3.21 u[IU]/mL (ref 0.450–4.500)
Vitamin B-12: 699 pg/mL (ref 232–1245)

## 2018-11-28 NOTE — Telephone Encounter (Signed)
Mcarthur Rossetti Josem Kaufmann: [id number] (exp. 11/28/18 to 12/28/18) order sent to GI. They will reach out to the patient to schedule.

## 2018-12-14 ENCOUNTER — Other Ambulatory Visit: Payer: Self-pay

## 2018-12-14 ENCOUNTER — Encounter: Payer: Self-pay | Admitting: Neurology

## 2018-12-14 ENCOUNTER — Ambulatory Visit (INDEPENDENT_AMBULATORY_CARE_PROVIDER_SITE_OTHER): Payer: Medicare HMO | Admitting: Neurology

## 2018-12-14 VITALS — BP 127/79 | HR 72 | Ht 68.0 in | Wt 176.0 lb

## 2018-12-14 DIAGNOSIS — R0683 Snoring: Secondary | ICD-10-CM

## 2018-12-14 DIAGNOSIS — R51 Headache: Secondary | ICD-10-CM

## 2018-12-14 DIAGNOSIS — R519 Headache, unspecified: Secondary | ICD-10-CM

## 2018-12-14 DIAGNOSIS — R351 Nocturia: Secondary | ICD-10-CM

## 2018-12-14 NOTE — Progress Notes (Signed)
Subjective:    Patient ID: Clifford Sheppard is a 73 y.o. male.  HPI     Star Age, MD, PhD [facility] [address] [place], [place] [address]  Dear Mamie Nick,   I saw your patient, Clifford Sheppard, upon your kind request to my sleep clinic today for initial consultation of his sleep disorder, in particular, concern for underlying obstructive sleep apnea.  The patient is accompanied by his wife today. As you know, Clifford Sheppard is a 73 year old right-handed gentleman with an underlying medical history of recurrent headaches, cognitive complaints, hyperlipidemia, reflux disease with history of hiatal hernia, coronary artery disease, arthritis and mildly overweight state, who reports snoring and excessive daytime somnolence as well as recurrent headaches since January 2020.  He has recently started Depakote for headache prevention.  He has not noticed much in the way of improvement and prior to that he tried gabapentin.  He is scheduled for another brain MRI as I understand.  I reviewed your office note from 11/27/2018.  His Epworth sleepiness score is 9 out of 24, fatigue severity score is 18 out of 63.  He is retired and lives with his wife and 1 son, he has 3 grown children, he is a non-smoker and drinks alcohol infrequently and caffeine occasionally in the form of tea or soda, no day-to-day caffeine, usually decaf coffee.  He tries to hydrate well with water.  He tries to be in bed around 930 or 10 and rise time is generally between 630 and 7 AM.  He has nocturia, on average 1-3 times per night.  He has woken up with a headache.  He does take a nap at times in the afternoons.  He feels that the headache is worse when he has to cough.  He has no family history of obstructive sleep apnea.  His weight has been stable.  His Past Medical History Is Significant For: Past Medical History:  Diagnosis Date  . Arthritis   . CAD (coronary artery disease)   . GERD  (gastroesophageal reflux disease)   . H/O hiatal hernia   . HLD (hyperlipidemia)     His Past Surgical History Is Significant For: Past Surgical History:  Procedure Laterality Date  . 2-D echocardiogram  11/05/2004   ejection fraction greater than 55% normal wall motion. Left atrium mildly dilated. Aortic valve mildly sclerotic.  Marland Kitchen CARDIAC CATHETERIZATION     10-12 YRS AGO   . cardiac stress test  10/24/2006   post-rest ejection fraction 74% no significant wall motion abnormalities noted. Was evidence of mild ischemia in the basal inferior and mid inferior region.  Marland Kitchen KNEE SURGERY     LEFT   ACL   1980S  . REFRACTIVE SURGERY     BILAT   . TOTAL HIP ARTHROPLASTY Right 02/08/2014   Procedure: RIGHT TOTAL HIP ARTHROPLASTY ANTERIOR APPROACH;  Surgeon: Alta Corning, MD;  Location: [facility];  Service: Orthopedics;  Laterality: Right;    His Family History Is Significant For: History reviewed. No pertinent family history.  His Social History Is Significant For: Social History   Socioeconomic History  . Marital status: Married    Spouse name: Not on file  . Number of children: Not on file  . Years of education: Not on file  . Highest education level: Not on file  Occupational History  . Not on file  Social Needs  . Financial resource strain: Not on file  . Food insecurity  Worry: Not on file    Inability: Not on file  . Transportation needs    Medical: Not on file    Non-medical: Not on file  Tobacco Use  . Smoking status: Never Smoker  . Smokeless tobacco: Never Used  Substance and Sexual Activity  . Alcohol use: Yes    Comment: OCC   . Drug use: No  . Sexual activity: Not on file  Lifestyle  . Physical activity    Days per week: Not on file    Minutes per session: Not on file  . Stress: Not on file  Relationships  . Social Herbalist on phone: Not on file    Gets together: Not on file    Attends religious service: Not on file    Active member of club or  organization: Not on file    Attends meetings of clubs or organizations: Not on file    Relationship status: Not on file  Other Topics Concern  . Not on file  Social History Narrative  . Not on file    His Allergies Are:  No Known Allergies:   His Current Medications Are:  Outpatient Encounter Medications as of 12/14/2018  Medication Sig  . aspirin 81 MG chewable tablet 1 tablet Orally Once a day  . atorvastatin (LIPITOR) 80 MG tablet Take 80 mg by mouth daily.  . divalproex (DEPAKOTE ER) 500 MG 24 hr tablet Take 1 tablet (500 mg total) by mouth daily.  Marland Kitchen ibuprofen (ADVIL) 800 MG tablet 1 tablet Orally Three times a day prn pain for 30 days  . metoprolol tartrate (LOPRESSOR) 25 MG tablet TAKE 1/2 TABLET TWICE DAILY  . Multiple Vitamin (MULTIVITAMIN) capsule Take 1 capsule by mouth daily.  . Omega-3 Fatty Acids (FISH OIL PO) Take 1,400 mg by mouth daily.  Marland Kitchen omeprazole (PRILOSEC) 20 MG capsule Take 20 mg by mouth daily as needed (for acid reflux).  . REDNESS RELIEVER EYE DROPS 0.05 % ophthalmic solution Place 2 drops into both eyes as needed (for dry or red eyes).   . sodium chloride (OCEAN) 0.65 % nasal spray Place 2-3 sprays into both nostrils as needed for congestion.    No facility-administered encounter medications on file as of 12/14/2018.   :  Review of Systems:  Out of a complete 14 point review of systems, all are reviewed and negative with the exception of these symptoms as listed below: Review of Systems  Neurological:       Pt presents today to discuss his sleep. Pt has never had a sleep study but does endorse snoring.  Epworth Sleepiness Scale 0= would never doze 1= slight chance of dozing 2= moderate chance of dozing 3= high chance of dozing  Sitting and reading: 1 Watching TV: 2 Sitting inactive in a public place (ex. Theater or meeting): 1  As a passenger in a car for an hour without a break: 2 Lying down to rest in the afternoon: 2 Sitting and talking to  someone: 0 Sitting quietly after lunch (no alcohol): 1 In a car, while stopped in traffic: 0 Total: 9     Objective:  Neurological Exam  Physical Exam Physical Examination:   Vitals:   12/14/18 1543  BP: 127/79  Pulse: 72    General Examination: The patient is a very pleasant 73 y.o. male in no acute distress. He appears well-developed and well-nourished and well groomed.   HEENT: Normocephalic, atraumatic, pupils are equal, round and reactive to light.  Extraocular tracking is good without limitation to gaze excursion or nystagmus noted. Normal smooth pursuit is noted. Hearing is grossly intact. Face is symmetric with normal facial animation and normal facial sensation. Speech is clear with no dysarthria noted. There is no hypophonia. There is no lip, neck/head, jaw or voice tremor. Neck is supple with full range of passive and active motion. There are no carotid bruits on auscultation. Oropharynx exam reveals: mild mouth dryness, adequate dental hygiene and mild airway crowding, due to Wider uvula and redundant soft palate, tonsils are small, Mallampati class II.  Neck circumference is 15-1/4 inches. Tongue protrudes centrally and palate elevates symmetrically.   Chest: Clear to auscultation without wheezing, rhonchi or crackles noted.  Heart: S1+S2+0, regular and normal without murmurs, rubs or gallops noted.   Abdomen: Soft, non-tender and non-distended with normal bowel sounds appreciated on auscultation.  Extremities: There is no pitting edema in the distal lower extremities bilaterally.   Skin: Warm and dry without trophic changes noted.  Musculoskeletal: exam reveals no obvious joint deformities, tenderness or joint swelling or erythema,With the exception of arthritic changes in both hands.  Neurologically:  Mental status: The patient is awake, alert and oriented in all 4 spheres. His immediate and remote memory, attention, language skills and fund of knowledge are  appropriate. There is no evidence of aphasia, agnosia, apraxia or anomia. Speech is clear with normal prosody and enunciation. Thought process is linear. Mood is normal and affect is normal.  Cranial nerves II - XII are as described above under HEENT exam. In addition: shoulder shrug is normal with equal shoulder height noted. Motor exam: Normal bulk, strength and tone is noted. There is no tremor, Romberg is negative. Fine motor skills and coordination: grossly intact.  Cerebellar testing: No dysmetria or intention tremor.  Sensory exam: intact to light touch.  Gait, station and balance: He stands easily. No veering to one side is noted. No leaning to one side is noted. Posture is age-appropriate and stance is narrow based. Gait shows normal stride length and normal pace. No problems turning are noted.   Assessment and Plan:   In summary, Clifford Sheppard is a very pleasant 73 y.o.-year old male with an underlying medical history of recurrent headaches, cognitive complaints, hyperlipidemia, reflux disease with history of hiatal hernia, coronary artery disease, arthritis and mildly overweight state, whose history and physical exam are concerning for obstructive sleep apnea (OSA). I had a long chat with the patient and his wife about my findings and the diagnosis of OSA, its prognosis and treatment options. We talked about medical treatments, surgical interventions and non-pharmacological approaches. I explained in particular the risks and ramifications of untreated moderate to severe OSA, especially with respect to developing cardiovascular disease down the Road, including congestive heart failure, difficult to treat hypertension, cardiac arrhythmias, or stroke. Even type 2 diabetes has, in part, been linked to untreated OSA. Symptoms of untreated OSA include daytime sleepiness, memory problems, mood irritability and mood disorder such as depression and anxiety, lack of energy, as well as recurrent headaches,  especially morning headaches. He is agreeable to proceeding with a sleep study.  I explained the sleep test procedure the patient and his wife.  He would be willing to try CPAP therapy if the need arises.  We will reconvene after the sleep study is completed.  I answered all their questions today and the patient and his wife were in agreement.  Thank you very much for allowing me to participate in  the care of this patient. If I can be of any further assistance to you please do not hesitate to talk to me.  Sincerely,   Star Age, MD, PhD

## 2018-12-14 NOTE — Patient Instructions (Signed)

## 2018-12-29 ENCOUNTER — Emergency Department (HOSPITAL_COMMUNITY)
Admission: EM | Admit: 2018-12-29 | Discharge: 2018-12-29 | Disposition: A | Discharge status: Home / Self Care | Payer: Medicare HMO | Attending: Emergency Medicine | Admitting: Emergency Medicine

## 2018-12-29 ENCOUNTER — Telehealth: Payer: Self-pay | Admitting: Neurology

## 2018-12-29 ENCOUNTER — Ambulatory Visit
Admission: RE | Admit: 2018-12-29 | Discharge: 2018-12-29 | Disposition: A | Discharge status: Home / Self Care | Payer: Medicare HMO | Source: Ambulatory Visit | Attending: Neurology | Admitting: Neurology

## 2018-12-29 ENCOUNTER — Encounter (HOSPITAL_COMMUNITY): Payer: Self-pay | Admitting: Neurological Surgery

## 2018-12-29 ENCOUNTER — Other Ambulatory Visit: Payer: Self-pay

## 2018-12-29 DIAGNOSIS — Z7982 Long term (current) use of aspirin: Secondary | ICD-10-CM | POA: Diagnosis not present

## 2018-12-29 DIAGNOSIS — I6200 Nontraumatic subdural hemorrhage, unspecified: Secondary | ICD-10-CM | POA: Diagnosis not present

## 2018-12-29 DIAGNOSIS — R51 Headache: Secondary | ICD-10-CM | POA: Diagnosis present

## 2018-12-29 DIAGNOSIS — R413 Other amnesia: Secondary | ICD-10-CM

## 2018-12-29 DIAGNOSIS — S065X9A Traumatic subdural hemorrhage with loss of consciousness of unspecified duration, initial encounter: Secondary | ICD-10-CM

## 2018-12-29 DIAGNOSIS — S065XAA Traumatic subdural hemorrhage with loss of consciousness status unknown, initial encounter: Secondary | ICD-10-CM

## 2018-12-29 DIAGNOSIS — I2510 Atherosclerotic heart disease of native coronary artery without angina pectoris: Secondary | ICD-10-CM | POA: Diagnosis not present

## 2018-12-29 DIAGNOSIS — Z79899 Other long term (current) drug therapy: Secondary | ICD-10-CM | POA: Insufficient documentation

## 2018-12-29 LAB — CBC WITH DIFFERENTIAL/PLATELET
Abs Immature Granulocytes: 0.02 10*3/uL (ref 0.00–0.07)
Basophils Absolute: 0 10*3/uL (ref 0.0–0.1)
Basophils Relative: 1 %
Eosinophils Absolute: 0.2 10*3/uL (ref 0.0–0.5)
Eosinophils Relative: 3 %
HCT: 48.5 % (ref 39.0–52.0)
Hemoglobin: 17.3 g/dL — ABNORMAL HIGH (ref 13.0–17.0)
Immature Granulocytes: 0 %
Lymphocytes Relative: 28 %
Lymphs Abs: 1.8 10*3/uL (ref 0.7–4.0)
MCH: 33.1 pg (ref 26.0–34.0)
MCHC: 35.7 g/dL (ref 30.0–36.0)
MCV: 92.7 fL (ref 80.0–100.0)
Monocytes Absolute: 0.5 10*3/uL (ref 0.1–1.0)
Monocytes Relative: 7 %
Neutro Abs: 3.8 10*3/uL (ref 1.7–7.7)
Neutrophils Relative %: 61 %
Platelets: 152 10*3/uL (ref 150–400)
RBC: 5.23 MIL/uL (ref 4.22–5.81)
RDW: 12.1 % (ref 11.5–15.5)
WBC: 6.3 10*3/uL (ref 4.0–10.5)
nRBC: 0 % (ref 0.0–0.2)

## 2018-12-29 LAB — BASIC METABOLIC PANEL
Anion gap: 10 (ref 5–15)
BUN: 13 mg/dL (ref 8–23)
CO2: 26 mmol/L (ref 22–32)
Calcium: 9.3 mg/dL (ref 8.9–10.3)
Chloride: 103 mmol/L (ref 98–111)
Creatinine, Ser: 1.03 mg/dL (ref 0.61–1.24)
GFR calc Af Amer: >60 mL/min (ref >60)
GFR calc non Af Amer: >60 mL/min (ref >60)
Glucose, Bld: 113 mg/dL — ABNORMAL HIGH (ref 70–99)
Potassium: 3.7 mmol/L (ref 3.5–5.1)
Sodium: 139 mmol/L (ref 135–145)

## 2018-12-29 LAB — PROTIME-INR
INR: 1 (ref 0.8–1.2)
Prothrombin Time: 12.9 seconds (ref 11.4–15.2)

## 2018-12-29 MED ORDER — GADOBENATE DIMEGLUMINE 529 MG/ML IV SOLN
15.0000 mL | Freq: Once | INTRAVENOUS | Status: AC | PRN
  Administered 2018-12-29: 15 mL via INTRAVENOUS

## 2018-12-29 NOTE — ED Provider Notes (Signed)
[name] EMERGENCY DEPARTMENT Provider Note   CSN: [id number] Arrival date & time: 12/29/18  1811     History   Chief Complaint No chief complaint on file.   HPI Clifford Sheppard is a 73 y.o. male.     HPI  This patient is a 73 year old male, history of coronary disease on a baby aspirin, no history of stroke or subdural hematoma or trauma to the head however he states he has been having headaches for quite some time.  In fact x-rays of his head go back as far as May 10, 2018 where he had a CT scan of the brain is full well as an MRI of the brain.  At that time the MRI did not show any specific findings, just chronic small vessel disease.  Due to ongoing headaches he re-presented to the neurologist who ordered another MRI and this time found bilateral subdural hematomas in the frontoparietal region left greater than right, left side 11 mm, right side 3 mm.  The patient denies any neurologic symptoms whatsoever.  He has no coughing shortness of breath fevers chills nausea vomiting or diarrhea.  He still has ongoing headaches.  They seem to be getting worse over time.  Again he does not recall any injury to the brain, the only anticoagulant he takes is a baby aspirin.  Past Medical History:  Diagnosis Date   Arthritis    CAD (coronary artery disease)    GERD (gastroesophageal reflux disease)    H/O hiatal hernia    HLD (hyperlipidemia)     Patient Active Problem List   Diagnosis Date Noted   Primary osteoarthritis of right hip 02/08/2014   Coronary artery disease 11/30/2012   HLD (hyperlipidemia) 11/30/2012    Past Surgical History:  Procedure Laterality Date   2-D echocardiogram  11/05/2004   ejection fraction greater than 55% normal wall motion. Left atrium mildly dilated. Aortic valve mildly sclerotic.   CARDIAC CATHETERIZATION     10-12 YRS AGO    cardiac stress test  10/24/2006   post-rest ejection fraction 74% no significant wall  motion abnormalities noted. Was evidence of mild ischemia in the basal inferior and mid inferior region.   KNEE SURGERY     LEFT   ACL   1980S   REFRACTIVE SURGERY     BILAT    TOTAL HIP ARTHROPLASTY Right 02/08/2014   Procedure: RIGHT TOTAL HIP ARTHROPLASTY ANTERIOR APPROACH;  Surgeon: Alta Corning, MD;  Location: [facility];  Service: Orthopedics;  Laterality: Right;        Home Medications    Prior to Admission medications   Medication Sig Start Date End Date Taking? Authorizing Provider  atorvastatin (LIPITOR) 80 MG tablet Take 80 mg by mouth daily. 10/23/12   [provider]  divalproex (DEPAKOTE ER) 500 MG 24 hr tablet Take 1 tablet (500 mg total) by mouth daily. 11/27/18 11/27/19  Garvin Fila, MD  ibuprofen (ADVIL) 800 MG tablet 1 tablet Orally Three times a day prn pain for 30 days 04/19/18   [provider]  metoprolol tartrate (LOPRESSOR) 25 MG tablet TAKE 1/2 TABLET TWICE DAILY 07/26/18   Lorretta Harp, MD  Multiple Vitamin (MULTIVITAMIN) capsule Take 1 capsule by mouth daily.    [provider]  Omega-3 Fatty Acids (FISH OIL PO) Take 1,400 mg by mouth daily.    [provider]  omeprazole (PRILOSEC) 20 MG capsule Take 20 mg by mouth daily as needed (for acid  reflux).    [provider]  REDNESS RELIEVER EYE DROPS 0.05 % ophthalmic solution Place 2 drops into both eyes as needed (for dry or red eyes).  09/20/13   [provider]  sodium chloride (OCEAN) 0.65 % nasal spray Place 2-3 sprays into both nostrils as needed for congestion.  10/26/13   [provider]    Family History History reviewed. No pertinent family history.  Social History Social History   Tobacco Use   Smoking status: Never Smoker   Smokeless tobacco: Never Used  Substance Use Topics   Alcohol use: Yes    Comment: OCC    Drug use: No     Allergies   Patient has no known allergies.   Review of Systems Review of Systems  All  other systems reviewed and are negative.    Physical Exam Updated Vital Signs BP 137/80    Pulse 64    Temp 98.3 F (36.8 C) (Oral)    Resp (!) 21    Ht 1.727 m (5\' 8" )    Wt 80.7 kg    SpO2 97%    BMI 27.06 kg/m   Physical Exam Vitals signs and nursing note reviewed.  Constitutional:      General: He is not in acute distress.    Appearance: He is well-developed.  HENT:     Head: Normocephalic and atraumatic.     Comments: No signs of trauma to the head    Mouth/Throat:     Pharynx: No oropharyngeal exudate.  Eyes:     General: No scleral icterus.       Right eye: No discharge.        Left eye: No discharge.     Conjunctiva/sclera: Conjunctivae normal.     Pupils: Pupils are equal, round, and reactive to light.  Neck:     Musculoskeletal: Normal range of motion and neck supple.     Thyroid: No thyromegaly.     Vascular: No JVD.  Cardiovascular:     Rate and Rhythm: Normal rate and regular rhythm.     Heart sounds: Normal heart sounds. No murmur. No friction rub. No gallop.   Pulmonary:     Effort: Pulmonary effort is normal. No respiratory distress.     Breath sounds: Normal breath sounds. No wheezing or rales.  Abdominal:     General: Bowel sounds are normal. There is no distension.     Palpations: Abdomen is soft. There is no mass.     Tenderness: There is no abdominal tenderness.  Musculoskeletal: Normal range of motion.        General: No tenderness.  Lymphadenopathy:     Cervical: No cervical adenopathy.  Skin:    General: Skin is warm and dry.     Findings: No erythema or rash.  Neurological:     Mental Status: He is alert.     Coordination: Coordination normal.     Comments: speech is clear, cranial nerves III through XII are intact, memory is intact, strength is normal in all 4 extremities including grips, sensation is intact to light touch and pinprick in all 4 extremities. Coordination as tested by finger-nose-finger is normal, no limb ataxia. Normal gait,    Psychiatric:        Behavior: Behavior normal.      ED Treatments / Results  Labs (all labs ordered are listed, but only abnormal results are displayed) Labs Reviewed  CBC WITH DIFFERENTIAL/PLATELET - Abnormal; Notable for the following components:  Result Value   Hemoglobin 17.3 (*)    All other components within normal limits  BASIC METABOLIC PANEL - Abnormal; Notable for the following components:   Glucose, Bld 113 (*)    All other components within normal limits  PROTIME-INR    EKG EKG Interpretation  Date/Time:  Friday December 29 2018 18:45:39 EDT Ventricular Rate:  64 PR Interval:    QRS Duration: 90 QT Interval:  419 QTC Calculation: 433 R Axis:   41 Text Interpretation:  Sinus rhythm Multiple ventricular premature complexes Probable left atrial enlargement Baseline wander in lead(s) V4 No old tracing to compare Confirmed by Noemi Chapel [phone]) on 12/29/2018 7:07:19 PM   Radiology Mr Jeri Cos Wo Contrast  Result Date: 12/29/2018 CLINICAL DATA:  Memory loss Creatinine was obtained on site at [facility] at [address] Results: Creatinine 1.0 mg/dL (74). EXAM: MRI HEAD WITHOUT AND WITH CONTRAST TECHNIQUE: Multiplanar, multiecho pulse sequences of the brain and surrounding structures were obtained without and with intravenous contrast. CONTRAST:  23mL MULTIHANCE GADOBENATE DIMEGLUMINE 529 MG/ML IV SOLN COMPARISON:  Brain MRI 06/28/2018, head CT 05/10/2018 FINDINGS: Brain: New from prior brain MRI 08/28/2018, there are bilateral subdural hematomas overlying the frontoparietal lobes, the larger on the left measuring up to 11 mm in thickness. The right-sided subdural hematoma is thin measuring only 3 mm in thickness. The hematomas demonstrate T1 intermediate signal and T2/FLAIR hyperintensity. There are small scattered regions of T1 hyperintensity within the bilateral subdural hematomas as well as peripheral enhancement. Mild mass effect upon the  underlying left cerebral hemisphere. There is trace 1-2 mm leftward midline shift. No evidence of acute infarct. No evidence of intracranial mass. Mild scattered T2/FLAIR hyperintensity within the cerebral white matter is nonspecific, but consistent with chronic small vessel ischemic disease. Cerebral volume is normal for age. There is more generalized pachymeningeal dural thickening and enhancement. No abnormal parenchymal enhancement. Vascular: Flow voids maintained within the proximal large arterial vessels. Skull and upper cervical spine: No focal marrow lesion. Sinuses/Orbits: Visualized orbits demonstrate no acute abnormality. Mild scattered paranasal sinus mucosal thickening. Trace fluid within left mastoid air cells. The on-call physician from the ordering practice was not available at the time of imaging. As a result, today's imaging findings were discussed directly with the patient via telephone while the patient remained at the imaging facility. It was recommended to the patient that he present to the emergency department for further evaluation. The patient expressed understanding and intent to have his wife drive him to [facility] emergency department. Subsequently, I discussed the imaging findings with Dr. Tyrone Nine of the [facility] emergency department by telephone at approximately 6 p.m. on 12/29/2018 and alerted Dr. Tyrone Nine to Mr. Moscone anticipated arrival. IMPRESSION: 1. Bilateral subdural hematomas overlying the frontoparietal lobes as described and new from prior brain MRI 06/28/2018. The larger hematoma on the left measures up to 11 mm in thickness and exerts mild mass effect upon the underlying left cerebral hemisphere. Trace 1-2 mm rightward midline shift. The right subdural hematoma is thin measuring only 3 mm in thickness. 2. Pachymeningeal dural thickening and enhancement. This is a nonspecific finding, but can be seen in the setting of intracranial hypotension. 3. Mild chronic small vessel  ischemic disease. Electronically Signed   By: Kellie Simmering   On: 12/29/2018 18:09    Procedures Procedures (including critical care time)  Medications Ordered in ED Medications - No data to display   Initial Impression / Assessment and Plan / ED  Course  I have reviewed the triage vital signs and the nursing notes.  Pertinent labs & imaging results that were available during my care of the patient were reviewed by me and considered in my medical decision making (see chart for details).       I have personally viewed the images of the MRI as well as the images of the CT scan that was performed 8 months ago.  At this time the patient will need consultation with neurosurgery.  He does not have any acute findings on exam to suggest that this is worsening that there is shift or compression of the brain, he appears to be otherwise tolerating the headaches very well and has no focal neurologic symptoms.  Neurosurgery consulted, paged at 6:40 PM  D/w Maudie Mercury with [facility] - request to come see pt and make recommendations.  CT scan outpatient recommended in 6 days, neurosurgery has seen the patient at the bedside, appreciate Dr. Ronnald Ramp and his recommendations.  The patient is stable for discharge at this time    Final Clinical Impressions(s) / ED Diagnoses   Final diagnoses:  Subdural hematoma [facility]    ED Discharge Orders    None       Noemi Chapel, MD 12/29/18 2037

## 2018-12-29 NOTE — Telephone Encounter (Signed)
Patient was sent directly to the ED today due to critical findings. See below MRI report  IMPRESSION: 1. Bilateral subdural hematomas overlying the frontoparietal lobes as described and new from prior brain MRI 06/28/2018. The larger hematoma on the left measures up to 11 mm in thickness and exerts mild mass effect upon the underlying left cerebral hemisphere. Trace 1-2 mm rightward midline shift. The right subdural hematoma is thin measuring only 3 mm in thickness. 2. Pachymeningeal dural thickening and enhancement. This is a nonspecific finding, but can be seen in the setting of intracranial hypotension. 3. Mild chronic small vessel ischemic disease.

## 2018-12-29 NOTE — Consult Note (Signed)
Reason for Consult:SDH Referring Physician: EDP  CORTEZ COAST is an 73 y.o. male.   HPI:  73 year old gentleman with a one-year history of headaches.  They are stable.  Holocephalic.  Worse with cough or sneeze.  No numbness tingling or weakness.  No difficulty with speech.  He had a CT scan and an MRI 6 months ago and had a follow-up scan today with and without contrast at the request of his neurologist.  This showed the small subdural hematomas and he was sent directly to the emergency department for evaluation.  His headaches are very mild.  They are aching.  No visual changes.  Not progressive.  He takes an 81 mg aspirin.  He takes no other blood thinners.  He cannot recall any trauma or fall.  Past Medical History:  Diagnosis Date  . Arthritis   . CAD (coronary artery disease)   . GERD (gastroesophageal reflux disease)   . H/O hiatal hernia   . HLD (hyperlipidemia)     Past Surgical History:  Procedure Laterality Date  . 2-D echocardiogram  11/05/2004   ejection fraction greater than 55% normal wall motion. Left atrium mildly dilated. Aortic valve mildly sclerotic.  [facility] CARDIAC CATHETERIZATION     10-12 YRS AGO   . cardiac stress test  10/24/2006   post-rest ejection fraction 74% no significant wall motion abnormalities noted. Was evidence of mild ischemia in the basal inferior and mid inferior region.  [facility] KNEE SURGERY     LEFT   ACL   1980S  . REFRACTIVE SURGERY     BILAT   . TOTAL HIP ARTHROPLASTY Right 02/08/2014   Procedure: RIGHT TOTAL HIP ARTHROPLASTY ANTERIOR APPROACH;  Surgeon: Alta Corning, MD;  Location: [facility];  Service: Orthopedics;  Laterality: Right;    No Known Allergies  Social History   Tobacco Use  . Smoking status: Never Smoker  . Smokeless tobacco: Never Used  Substance Use Topics  . Alcohol use: Yes    Comment: OCC     History reviewed. No pertinent family history.   Review of Systems  Positive ROS: neg  All other systems have been reviewed and  were otherwise negative with the exception of those mentioned in the HPI and as above.  Objective: Vital signs in last 24 hours: Temp:  [98.3 F (36.8 C)] 98.3 F (36.8 C) (09/25 1833) Pulse Rate:  [58-63] 58 (09/25 1915) Resp:  [15-21] 16 (09/25 1915) BP: (137-170)/(80-91) 137/80 (09/25 1915) SpO2:  [98 %-100 %] 100 % (09/25 1915) Weight:  [80.7 kg] 80.7 kg (09/25 1839)  General Appearance: Alert, cooperative, no distress, appears stated age Head: Normocephalic, without obvious abnormality, atraumatic Eyes: PERRL, conjunctiva/corneas clear, EOM's intact    Throat: benign Neck: Supple Lungs:  respirations unlabored Heart: Regular rate and rhythm Abdomen: Soft Extremities: Extremities normal, atraumatic, no cyanosis or edema Pulses: 2+ and symmetric all extremities Skin: Skin color, texture, turgor normal, no rashes or lesions  NEUROLOGIC:   Mental status: A&O x4, no aphasia, good attention span, Memory and fund of knowledge appear to be fully intact Motor Exam - grossly normal, normal tone and bulk, no pronator drift but slight tremor of right hand Sensory Exam - grossly normal Reflexes: symmetric, no pathologic reflexes, No Hoffman's, No clonus Coordination - grossly normal Gait -not tested Balance - grossly normal Cranial Nerves: I: smell Not tested  II: visual acuity  OS: na    OD: na  II: visual fields Full to confrontation  II:  pupils Equal, round, reactive to light  III,VII: ptosis None  III,IV,VI: extraocular muscles  Full ROM  V: mastication Normal  V: facial light touch sensation  Normal  V,VII: corneal reflex  Present  VII: facial muscle function - upper  Normal  VII: facial muscle function - lower Normal  VIII: hearing Not tested  IX: soft palate elevation  Normal  IX,X: gag reflex Present  XI: trapezius strength  5/5  XI: sternocleidomastoid strength 5/5  XI: neck flexion strength  5/5  XII: tongue strength  Normal    Data Review Lab Results   Component Value Date   WBC 6.3 12/29/2018   HGB 17.3 (H) 12/29/2018   HCT 48.5 12/29/2018   MCV 92.7 12/29/2018   PLT 152 12/29/2018   Lab Results  Component Value Date   NA 139 12/29/2018   K 3.7 12/29/2018   CL 103 12/29/2018   CO2 26 12/29/2018   BUN 13 12/29/2018   CREATININE 1.03 12/29/2018   GLUCOSE 113 (H) 12/29/2018   Lab Results  Component Value Date   INR 1.0 12/29/2018    Radiology: Mr Jeri Cos [id number] Contrast  Result Date: 12/29/2018 CLINICAL DATA:  Memory loss Creatinine was obtained on site at [facility] at [address] Results: Creatinine 1.0 mg/dL (74). EXAM: MRI HEAD WITHOUT AND WITH CONTRAST TECHNIQUE: Multiplanar, multiecho pulse sequences of the brain and surrounding structures were obtained without and with intravenous contrast. CONTRAST:  64mL MULTIHANCE GADOBENATE DIMEGLUMINE 529 MG/ML IV SOLN COMPARISON:  Brain MRI 06/28/2018, head CT 05/10/2018 FINDINGS: Brain: New from prior brain MRI 08/28/2018, there are bilateral subdural hematomas overlying the frontoparietal lobes, the larger on the left measuring up to 11 mm in thickness. The right-sided subdural hematoma is thin measuring only 3 mm in thickness. The hematomas demonstrate T1 intermediate signal and T2/FLAIR hyperintensity. There are small scattered regions of T1 hyperintensity within the bilateral subdural hematomas as well as peripheral enhancement. Mild mass effect upon the underlying left cerebral hemisphere. There is trace 1-2 mm leftward midline shift. No evidence of acute infarct. No evidence of intracranial mass. Mild scattered T2/FLAIR hyperintensity within the cerebral white matter is nonspecific, but consistent with chronic small vessel ischemic disease. Cerebral volume is normal for age. There is more generalized pachymeningeal dural thickening and enhancement. No abnormal parenchymal enhancement. Vascular: Flow voids maintained within the proximal large arterial vessels. Skull and upper  cervical spine: No focal marrow lesion. Sinuses/Orbits: Visualized orbits demonstrate no acute abnormality. Mild scattered paranasal sinus mucosal thickening. Trace fluid within left mastoid air cells. The on-call physician from the ordering practice was not available at the time of imaging. As a result, today's imaging findings were discussed directly with the patient via telephone while the patient remained at the imaging facility. It was recommended to the patient that he present to the emergency department for further evaluation. The patient expressed understanding and intent to have his wife drive him to [facility] emergency department. Subsequently, I discussed the imaging findings with Dr. Tyrone Nine of the [facility] emergency department by telephone at approximately 6 p.m. on 12/29/2018 and alerted Dr. Tyrone Nine to Mr. Galeano anticipated arrival. IMPRESSION: 1. Bilateral subdural hematomas overlying the frontoparietal lobes as described and new from prior brain MRI 06/28/2018. The larger hematoma on the left measures up to 11 mm in thickness and exerts mild mass effect upon the underlying left cerebral hemisphere. Trace 1-2 mm rightward midline shift. The right subdural hematoma is thin measuring only 3 mm  in thickness. 2. Pachymeningeal dural thickening and enhancement. This is a nonspecific finding, but can be seen in the setting of intracranial hypotension. 3. Mild chronic small vessel ischemic disease. Electronically Signed   By: Kellie Simmering   On: 12/29/2018 18:09     Assessment/Plan: Estimated body mass index is 27.06 kg/m as calculated from the following:   Height as of this encounter: 5\' 8"  (1.727 m).   Weight as of this encounter: 80.7 kg.   Very pleasant 73 year old gentleman with probably asymptomatic bilateral subdural hematomas, which are quite small and have minimal mass-effect.  No surgical intervention is needed at this time.  I think he safe for discharge home with plans to follow-up with  [place] in 6 days with a repeat CT scan to evaluate.  We will follow-up with serial CTs.  We have discussed all of this with him at length.  His headaches certainly predate the subdural hematomas and therefore I believe these are probably incidental findings.  No history of trauma that he can recall and therefore the etiology is unknown.  I have asked him to stop his aspirin for now.   Eustace Moore 12/29/2018 8:01 PM

## 2018-12-29 NOTE — ED Notes (Signed)
Pt verbalized understanding of discharge information. Acknowledged follow-up, has no further questions and was given time for teach back.

## 2018-12-29 NOTE — Discharge Instructions (Signed)
The surgeon will set up the CT scan for you as well as the follow up in the office. Stop aspirin immediately  ER for increased weakness, numbness, changes in vision or speech or coordination or ability to walk.

## 2018-12-29 NOTE — ED Triage Notes (Signed)
Pt came by the request from his Dr after his MRi showed possible subdural hematoma. Pt denies vision changes.

## 2019-01-01 ENCOUNTER — Ambulatory Visit (INDEPENDENT_AMBULATORY_CARE_PROVIDER_SITE_OTHER): Payer: Medicare HMO | Admitting: Neurology

## 2019-01-01 ENCOUNTER — Other Ambulatory Visit: Payer: Self-pay

## 2019-01-01 DIAGNOSIS — R0683 Snoring: Secondary | ICD-10-CM

## 2019-01-01 DIAGNOSIS — R351 Nocturia: Secondary | ICD-10-CM

## 2019-01-01 DIAGNOSIS — G4733 Obstructive sleep apnea (adult) (pediatric): Secondary | ICD-10-CM | POA: Diagnosis not present

## 2019-01-01 DIAGNOSIS — R519 Headache, unspecified: Secondary | ICD-10-CM

## 2019-01-03 NOTE — Telephone Encounter (Signed)
I called and left a message on the patient's listed phone number to call me back to discuss MRI results.

## 2019-01-04 DIAGNOSIS — S065X9A Traumatic subdural hemorrhage with loss of consciousness of unspecified duration, initial encounter: Secondary | ICD-10-CM | POA: Diagnosis not present

## 2019-01-04 NOTE — Progress Notes (Signed)
Patient referred by Dr. Leonie Man, seen by me on 12/14/18, HST on 01/01/19.    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 While I recommend treatment for this in the form CPAP, his insurance will not approve a sleep study for this. They will likely only approve a trial of autoPAP, which means, that we don't have to bring him in for a sleep study with CPAP, but will let him start using a so called autoPAP machine at home, through a DME company (of his choice, or as per insurance requirement). The DME representative will educate him on how to use the machine, how to put the mask on, etc. I have placed an order in the chart. Please send referral, talk to patient, send report to referring MD. We will need a FU in sleep clinic for 10 weeks post-PAP set up, please arrange that with me or one of our NPs. Thanks,   Star Age, MD, PhD [facility])

## 2019-01-04 NOTE — Addendum Note (Signed)
Addended by: Star Age on: 01/04/2019 07:07 PM   Modules accepted: Orders

## 2019-01-04 NOTE — Procedures (Signed)
Patient Information     First Name: Clifford Last Name: Sheppard ID: [id number]  Birth Date: May 12, 1945 Age: 73 Gender: Male  Referring Provider: Dr. Leonie Man BMI: 26.7 (W=176 lb, H=5' 8'')  Neck Circ.:  15 '' Epworth:  9/24   Sleep Study Information    Study Date: Jan 01, 2019 S/H/A Version: 001.001.001.001 / 4.1.1528 / 37  History:    73 year old man with a history of recurrent headaches, cognitive complaints, hyperlipidemia and reflux disease with history of hiatal hernia, coronary artery disease, arthritis and mildly overweight state, who reports snoring and excessive daytime somnolence.  Summary & Diagnosis:    OSA  Recommendations:     This home sleep test demonstrates severe obstructive sleep apnea with a total AHI of 37.8/hour and O2 nadir of 88%. Treatment with positive airway pressure (in the form of CPAP) is recommended. This will require a full night CPAP titration study for proper treatment settings, O2 monitoring and mask fitting. Based on the severity of the sleep disordered breathing an attended titration study is indicated. However, patient's insurance has denied an attended sleep study; therefore, the patient will be advised to proceed with an autoPAP titration/trial at home for now. Please note that untreated obstructive sleep apnea may carry additional perioperative morbidity. Patients with significant obstructive sleep apnea should receive perioperative PAP therapy and the surgeons and particularly the anesthesiologist should be informed of the diagnosis and the severity of the sleep disordered breathing. The patient should be cautioned not to drive, work at heights, or operate dangerous or heavy equipment when tired or sleepy. Review and reiteration of good sleep hygiene measures should be pursued with any patient. Other causes of the patient's symptoms, including circadian rhythm disturbances, an underlying mood disorder, medication effect and/or an underlying medical problem cannot be  ruled out based on this test. Clinical correlation is recommended. The patient and his referring provider will be notified of the test results. The patient will be seen in follow up in sleep clinic at [facility].  I certify that I have reviewed the raw data recording prior to the issuance of this report in accordance with the standards of the American Academy of Sleep Medicine (AASM).  Star Age, MD, PhD [facility]) Diplomat, ABPN (Neurology and Sleep)                 Sleep Summary  Oxygen Saturation Statistics   Start Study Time: End Study Time: Total Recording Time:  10:04:53 PM   6:46:45 AM   8 h, 41 min  Total Sleep Time % REM of Sleep Time:  7 h, 20 min  16.5    Mean: 95 Minimum: 88 Maximum: 100  Mean of Desaturations Nadirs (%):   93  Oxygen Desaturation. %:   4-9 10-20 >20 Total  Events Number Total   149  1 99.3 0.7  0 0.0  150 100.0  Oxygen Saturation: <90 <=88 <85 <80 <70  Duration (minutes): Sleep % 0.3 0.1  0.1 0.0  0.0 0.0 0.0 0.0 0.0 0.0     Respiratory Indices      Total Events REM NREM All Night  pRDI:  298  pAHI:  275 ODI:  150  pAHIc:  68  % CSR: 0.0 40.8 35.7 15.3 2.6 41.0 38.3 21.7 10.7 41.0 37.8 20.6 9.4       Pulse Rate Statistics during Sleep (BPM)      Mean:  60 Minimum: N/A Maximum: 86    Indices are  calculated using technically valid sleep time of  7 hrs, 16 min. pRDI/pAHI are calculated using oxi desaturations ? 3%  Body Position Statistics  Position Supine Prone Right Left Non-Supine  Sleep (min) 13.5 104.0 129.5 193.0 426.5  Sleep % 3.1 23.6 29.4 43.9 96.9  pRDI 72.3 25.4 38.8 48.9 40.0  pAHI 72.3 17.9 37.8 46.3 36.8  ODI 40.7 8.1 19.6 26.8 20.0     Snoring Statistics Snoring Level (dB) >40 >50 >60 >70 >80 >Threshold (45)  Sleep (min) 245.7 96.3 4.2 0.4 0.0 133.3  Sleep % 55.8 21.9 1.0 0.1 0.0 30.3    Mean: 45 dB Sleep Stages Chart                                                           pAHI=37.8                                                                      Mild              Moderate                    Severe                                                 5              15                    30

## 2019-01-08 ENCOUNTER — Telehealth: Payer: Self-pay

## 2019-01-08 NOTE — Telephone Encounter (Signed)
Pt returned my call and I had an extended conversation with him. I advised pt that Dr. Rexene Alberts reviewed their sleep study results and found that pt has severe osa. Dr. Rexene Alberts recommends that pt start an auto pap at home. I reviewed PAP compliance expectations with the pt. Pt is agreeable to starting an auto-PAP. I advised pt that an order will be sent to a DME, [facility], and [facility] will call the pt within about one week after they file with the pt's insurance. [facility] will show the pt how to use the machine, fit for masks, and troubleshoot the auto-PAP if needed. A follow up appt was made for insurance purposes with Amy, NP on 03/26/2019 at 3:00pm. Pt verbalized understanding to arrive 15 minutes early and bring their auto-PAP. A letter with all of this information in it will be mailed to the pt as a reminder. I verified with the pt that the address we have on file is correct. Pt verbalized understanding of results. Pt had no questions at this time but was encouraged to call back if questions arise. I have sent the order to [facility] and have received confirmation that they have received the order.

## 2019-01-08 NOTE — Telephone Encounter (Signed)
-----   Message from Star Age, MD sent at 01/04/2019  7:06 PM EDT ----- Patient referred by Dr. Leonie Man, seen by me on 12/14/18, HST on 01/01/19.    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 While I recommend treatment for this in the form CPAP, his insurance will not approve a sleep study for this. They will likely only approve a trial of autoPAP, which means, that we don't have to bring him in for a sleep study with CPAP, but will let him start using a so called autoPAP machine at home, through a DME company (of his choice, or as per insurance requirement). The DME representative will educate him on how to use the machine, how to put the mask on, etc. I have placed an order in the chart. Please send referral, talk to patient, send report to referring MD. We will need a FU in sleep clinic for 10 weeks post-PAP set up, please arrange that with me or one of our NPs. Thanks,   Star Age, MD, PhD [facility])

## 2019-01-08 NOTE — Telephone Encounter (Signed)
I called pt to discuss his sleep study results. No answer, left a message asking him to call me back. 

## 2019-01-10 ENCOUNTER — Other Ambulatory Visit: Payer: Self-pay | Admitting: Neurological Surgery

## 2019-01-10 ENCOUNTER — Ambulatory Visit
Admission: RE | Admit: 2019-01-10 | Discharge: 2019-01-10 | Disposition: A | Discharge status: Home / Self Care | Payer: Medicare HMO | Source: Ambulatory Visit | Attending: Neurological Surgery | Admitting: Neurological Surgery

## 2019-01-10 DIAGNOSIS — S065XAA Traumatic subdural hemorrhage with loss of consciousness status unknown, initial encounter: Secondary | ICD-10-CM

## 2019-01-10 DIAGNOSIS — R413 Other amnesia: Secondary | ICD-10-CM | POA: Diagnosis not present

## 2019-01-10 DIAGNOSIS — S065X9A Traumatic subdural hemorrhage with loss of consciousness of unspecified duration, initial encounter: Secondary | ICD-10-CM

## 2019-01-11 DIAGNOSIS — S065X9A Traumatic subdural hemorrhage with loss of consciousness of unspecified duration, initial encounter: Secondary | ICD-10-CM | POA: Diagnosis not present

## 2019-01-30 DIAGNOSIS — G4733 Obstructive sleep apnea (adult) (pediatric): Secondary | ICD-10-CM | POA: Diagnosis not present

## 2019-01-31 ENCOUNTER — Other Ambulatory Visit: Payer: Self-pay | Admitting: Neurological Surgery

## 2019-01-31 DIAGNOSIS — S065XAA Traumatic subdural hemorrhage with loss of consciousness status unknown, initial encounter: Secondary | ICD-10-CM

## 2019-01-31 DIAGNOSIS — S065X9A Traumatic subdural hemorrhage with loss of consciousness of unspecified duration, initial encounter: Secondary | ICD-10-CM

## 2019-02-12 ENCOUNTER — Ambulatory Visit
Admission: RE | Admit: 2019-02-12 | Discharge: 2019-02-12 | Disposition: A | Discharge status: Home / Self Care | Payer: Medicare HMO | Source: Ambulatory Visit | Attending: Neurological Surgery | Admitting: Neurological Surgery

## 2019-02-12 ENCOUNTER — Other Ambulatory Visit: Payer: Self-pay

## 2019-02-12 DIAGNOSIS — I6200 Nontraumatic subdural hemorrhage, unspecified: Secondary | ICD-10-CM | POA: Diagnosis not present

## 2019-02-12 DIAGNOSIS — S065XAA Traumatic subdural hemorrhage with loss of consciousness status unknown, initial encounter: Secondary | ICD-10-CM

## 2019-02-12 DIAGNOSIS — S065X9A Traumatic subdural hemorrhage with loss of consciousness of unspecified duration, initial encounter: Secondary | ICD-10-CM

## 2019-02-13 DIAGNOSIS — S065X9A Traumatic subdural hemorrhage with loss of consciousness of unspecified duration, initial encounter: Secondary | ICD-10-CM | POA: Diagnosis not present

## 2019-02-13 DIAGNOSIS — I10 Essential (primary) hypertension: Secondary | ICD-10-CM | POA: Diagnosis not present

## 2019-02-13 DIAGNOSIS — Z6825 Body mass index (BMI) 25.0-25.9, adult: Secondary | ICD-10-CM | POA: Diagnosis not present

## 2019-02-15 DIAGNOSIS — E782 Mixed hyperlipidemia: Secondary | ICD-10-CM | POA: Diagnosis not present

## 2019-02-15 DIAGNOSIS — Z125 Encounter for screening for malignant neoplasm of prostate: Secondary | ICD-10-CM | POA: Diagnosis not present

## 2019-02-15 DIAGNOSIS — I10 Essential (primary) hypertension: Secondary | ICD-10-CM | POA: Diagnosis not present

## 2019-02-15 DIAGNOSIS — I2510 Atherosclerotic heart disease of native coronary artery without angina pectoris: Secondary | ICD-10-CM | POA: Diagnosis not present

## 2019-02-15 DIAGNOSIS — K219 Gastro-esophageal reflux disease without esophagitis: Secondary | ICD-10-CM | POA: Diagnosis not present

## 2019-02-15 DIAGNOSIS — Z0000 Encounter for general adult medical examination without abnormal findings: Secondary | ICD-10-CM | POA: Diagnosis not present

## 2019-02-15 DIAGNOSIS — R7301 Impaired fasting glucose: Secondary | ICD-10-CM | POA: Diagnosis not present

## 2019-02-15 DIAGNOSIS — Z23 Encounter for immunization: Secondary | ICD-10-CM | POA: Diagnosis not present

## 2019-02-15 DIAGNOSIS — Z86010 Personal history of colonic polyps: Secondary | ICD-10-CM | POA: Diagnosis not present

## 2019-02-15 DIAGNOSIS — G4733 Obstructive sleep apnea (adult) (pediatric): Secondary | ICD-10-CM | POA: Diagnosis not present

## 2019-02-18 ENCOUNTER — Other Ambulatory Visit: Payer: Self-pay | Admitting: Neurology

## 2019-02-19 ENCOUNTER — Encounter: Payer: Self-pay | Admitting: Neurology

## 2019-02-19 ENCOUNTER — Other Ambulatory Visit: Payer: Self-pay

## 2019-02-19 ENCOUNTER — Ambulatory Visit (INDEPENDENT_AMBULATORY_CARE_PROVIDER_SITE_OTHER): Payer: Medicare HMO | Admitting: Neurology

## 2019-02-19 VITALS — BP 136/82 | HR 65 | Temp 97.5°F | Wt 183.0 lb

## 2019-02-19 DIAGNOSIS — I6203 Nontraumatic chronic subdural hemorrhage: Secondary | ICD-10-CM | POA: Diagnosis not present

## 2019-02-19 DIAGNOSIS — G44209 Tension-type headache, unspecified, not intractable: Secondary | ICD-10-CM | POA: Diagnosis not present

## 2019-02-19 MED ORDER — DIVALPROEX SODIUM ER 500 MG PO TB24: 500.0000 mg | ORAL_TABLET | Freq: Every day | ORAL | 1 refills | Status: DC

## 2019-02-19 NOTE — Patient Instructions (Signed)
I had a long discussion with the patient and his wife regarding his chronic headaches which seem like muscle tension headaches.  He feels Depakote is not helping so I recommend he taper it to 1 tablet every other day for the next 1 to 2 weeks and discontinue.  If headaches return he is willing to retry it at higher dose.  I recommend he do regular neck stretching exercises as well as local heat application.  We also discussed results of his MRI scan and CT scan suggesting bilateral subdural hematomas which are probably incidental and unrelated to his headaches.  He will continue conservative follow-up for that and follow-up with Dr. Sherley Bounds neurosurgeon.  He will resume aspirin 81 mg daily for his coronary artery disease.  He may return for follow-up in the future only as necessary and no schedule appointment was made

## 2019-02-19 NOTE — Progress Notes (Signed)
[facility] [address] [place]. [place] [address] [phone]       OFFICE CONSULT NOTE  Clifford Sheppard Date of Birth:  [birth year] Medical Record Number:  [id number]   Referring MD: Hulan Fess  Reason for Referral: Headaches  HPI: Initial visit 11/27/2018: Mr. Clifford Sheppard he is a 73 year old Caucasian male who developed new onset daily headaches since January 2020.  He describes the headache starting with initially some congestion of his sinuses and he was saw his primary care physician who treated him with a course of steroids for a week without relief.  The headaches were initially intermittent mostly increased with coughing and sneezing.  Headaches that time is posteriorly involving the back of the head and neck occasionally it is behind the eyes.  The headaches are mostly mild 3-4/10 in severity mostly he can get by without medications occasionally has tried taking Tylenol or ibuprofen with partial relief only.  He was seen by Dr. Domingo Cocking from headache and wellness clinic and unfortunately I do not have records from there to review today.  He apparently had a CT scan of the head on 05/10/2018 which was normal.  Is also subsequently had an MRI scan of the brain with and without contrast on 06/28/2018 which showed only mild age-appropriate changes of small vessel disease without acute abnormality.  Patient does complain of snoring at night as well as a daytime sleepiness despite sleeping 7 to 8 hours every night.  He denies being under significant stress.  He does play golf regularly but does not exercise regularly.  He denies any slurred speech, vertigo, focal extremity weakness, gait or balance difficulties.  There is no prior history of strokes TIAs seizures significant head injury or loss of consciousness.  He does complain of some occasional vision difficulties with his headaches but denies true loss of vision or diminished vision acuity.  He had ESR checked by primary physician  which was normal.  He states that he has cataracts in both eyes and plans to see an eye doctor to consider surgery soon.  The patient's wife has noticed that he is having some short-term memory difficulties as well in the last couple of months.  Patient feels he still independent and able to do most activities for himself though he is had to write things down in order to not forget.  There is no family history of Alzheimer's or dementia. Update 02/19/2019 : She returns for follow-up after last visit 3 months ago.  He still has daily headaches but they are not as bad.  He describes them as mild to moderate 1-2/10 in severity.  Mostly posterior and on his neck.  He does find some relief with using a vibrator on his neck muscles.  He has been tolerating Depakote ER 500 daily without any side effects but feels its not really helping him.  He underwent MRI scan of the brain on 12/29/2018 which shows bilateral chronic convexity subdural hematomas.  Patient denied any history of fall head injury or being on anticoagulants.  He was on aspirin 81 which was discontinued.  He did see Dr. Sherley Bounds neurosurgeon who recommended conservative follow-up.  Repeat CT scan of the head performed on 01/10/2019 as well as 02/22/2019 shows stable appearance of subdural hematomas.  He does have coronary artery disease and aspirin 81 mg has since been restarted.  He has been diagnosed with sleep apnea and sees Dr. Rexene Alberts in the sleep clinic and is started using CPAP every  night. ROS:   14 system review of systems is positive for headache, neck pain, muscle tightness, memory loss, vision difficulties, snoring, daytime tiredness and all other systems negative  PMH:  Past Medical History:  Diagnosis Date   Arthritis    CAD (coronary artery disease)    GERD (gastroesophageal reflux disease)    H/O hiatal hernia    HLD (hyperlipidemia)     Social History:  Social History   Socioeconomic History   Marital status: Married      Spouse name: Not on file   Number of children: Not on file   Years of education: Not on file   Highest education level: Not on file  Occupational History   Not on file  Social Needs   Financial resource strain: Not on file   Food insecurity    Worry: Not on file    Inability: Not on file   Transportation needs    Medical: Not on file    Non-medical: Not on file  Tobacco Use   Smoking status: Never Smoker   Smokeless tobacco: Never Used  Substance and Sexual Activity   Alcohol use: Yes    Comment: OCC    Drug use: No   Sexual activity: Not on file  Lifestyle   Physical activity    Days per week: Not on file    Minutes per session: Not on file   Stress: Not on file  Relationships   Social connections    Talks on phone: Not on file    Gets together: Not on file    Attends religious service: Not on file    Active member of club or organization: Not on file    Attends meetings of clubs or organizations: Not on file    Relationship status: Not on file   Intimate partner violence    Fear of current or ex partner: Not on file    Emotionally abused: Not on file    Physically abused: Not on file    Forced sexual activity: Not on file  Other Topics Concern   Not on file  Social History Narrative   Not on file    Medications:   Current Outpatient Medications on File Prior to Visit  Medication Sig Dispense Refill   atorvastatin (LIPITOR) 80 MG tablet Take 80 mg by mouth daily.     ibuprofen (ADVIL) 800 MG tablet 1 tablet Orally Three times a day prn pain for 30 days     metoprolol tartrate (LOPRESSOR) 25 MG tablet TAKE 1/2 TABLET TWICE DAILY 90 tablet 2   Multiple Vitamin (MULTIVITAMIN) capsule Take 1 capsule by mouth daily.     Omega-3 Fatty Acids (FISH OIL PO) Take 1,400 mg by mouth daily.     omeprazole (PRILOSEC) 20 MG capsule Take 20 mg by mouth daily as needed (for acid reflux).     REDNESS RELIEVER EYE DROPS 0.05 % ophthalmic solution  Place 2 drops into both eyes as needed (for dry or red eyes).      sodium chloride (OCEAN) 0.65 % nasal spray Place 2-3 sprays into both nostrils as needed for congestion.      No current facility-administered medications on file prior to visit.     Allergies:  No Known Allergies  Physical Exam General: well developed, well nourished elderly Caucasian male, seated, in no evident distress Head: head normocephalic and atraumatic.   Neck: supple with no carotid or supraclavicular bruits Cardiovascular: regular rate and rhythm, no murmurs Musculoskeletal: no  deformity Skin:  no rash/petichiae Vascular:  Normal pulses all extremities  Neurologic Exam Mental Status: Awake and fully alert. Oriented to place and time. Recent and remote memory intact. Attention span, concentration and fund of knowledge appropriate. Mood and affect appropriate.  Diminished recall 2/3.  Able to name only 57animals which can walk on 4 legs. Cranial Nerves: Fundoscopic exam reveals sharp disc margins. Pupils equal, briskly reactive to light. Extraocular movements full without nystagmus. Visual fields full to confrontation. Hearing intact. Facial sensation intact. Face, tongue, palate moves normally and symmetrically.  Motor: Normal bulk and tone. Normal strength in all tested extremity muscles.  Mild action tremor of right upper extremity. Sensory.: intact to touch , pinprick , position and vibratory sensation.  Coordination: Rapid alternating movements normal in all extremities. Finger-to-nose and heel-to-shin performed accurately bilaterally. Gait and Station: Arises from chair without difficulty. Stance is normal. Gait demonstrates normal stride length and balance . Able to heel, toe and tandem walk without difficulty.  Reflexes: 1+ and symmetric. Toes downgoing.       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  He has recently been diagnosed with sleep apnea for which she has started CPAP     PLAN: I had a long discussion with the patient and his wife regarding his chronic headaches which seem like muscle tension headaches.  He feels Depakote is not helping so I recommend he taper it to 1 tablet every other day for the next 1 to 2 weeks and discontinue.  If headaches return he is willing to retry it at higher dose.  I recommend he do regular neck stretching exercises as well as local heat application.  We also discussed results of his MRI scan and CT scan suggesting bilateral subdural hematomas which are probably incidental and unrelated to his headaches.  He will continue conservative follow-up for that and follow-up with Dr. Sherley Bounds neurosurgeon.  He will resume aspirin 81 mg daily for his coronary artery disease.  He may return for follow-up in the future only as necessary and no schedule appointment was made Greater than 50% time during this 25-minute  visit was spent on counseling and coordination of care about his daily headaches, bilateral subdural hematomas, as well as memory loss and mild cognitive impairment and answering questions.   Antony Contras, MD  [facility] [address] [place], [place] [address]  Phone [phone] Fax [phone] Note: This document was prepared with digital dictation and possible smart phrase technology. Any transcriptional errors that result from this process are unintentional.

## 2019-03-02 DIAGNOSIS — G4733 Obstructive sleep apnea (adult) (pediatric): Secondary | ICD-10-CM | POA: Diagnosis not present

## 2019-03-05 ENCOUNTER — Telehealth: Payer: Self-pay | Admitting: Cardiovascular Disease

## 2019-03-05 NOTE — Telephone Encounter (Signed)
Patient has appt with Dr. Gwenlyn Found tomorrow. Would like wife, Olegario Shearer, to come. I told him we were asking that patients come alone unless its a caregiver or for a medical reason. He states his wife goes with him to all his appts and wants her to be there.

## 2019-03-05 NOTE — Telephone Encounter (Signed)
Left message to call back  

## 2019-03-06 ENCOUNTER — Ambulatory Visit (INDEPENDENT_AMBULATORY_CARE_PROVIDER_SITE_OTHER): Payer: Medicare HMO | Admitting: Cardiovascular Disease

## 2019-03-06 ENCOUNTER — Encounter: Payer: Self-pay | Admitting: Cardiovascular Disease

## 2019-03-06 ENCOUNTER — Other Ambulatory Visit: Payer: Self-pay

## 2019-03-06 ENCOUNTER — Encounter: Payer: Self-pay | Admitting: Neurology

## 2019-03-06 DIAGNOSIS — G4733 Obstructive sleep apnea (adult) (pediatric): Secondary | ICD-10-CM | POA: Diagnosis not present

## 2019-03-06 DIAGNOSIS — E782 Mixed hyperlipidemia: Secondary | ICD-10-CM

## 2019-03-06 DIAGNOSIS — I2510 Atherosclerotic heart disease of native coronary artery without angina pectoris: Secondary | ICD-10-CM

## 2019-03-06 NOTE — Assessment & Plan Note (Signed)
History of hyperlipidemia on statin therapy with lipid profile performed 02/15/2019 revealing total cholesterol of 169, LDL of 85 and HDL 33

## 2019-03-06 NOTE — Assessment & Plan Note (Signed)
History of obstructive sleep apnea on CPAP which he does not feel that he benefits from.

## 2019-03-06 NOTE — Assessment & Plan Note (Signed)
History of CAD status post cardiac catheterization by myself May 1998 revealing a totally occluded RCA with left-to-right collaterals via the circumflex coronary artery.  Circumflex did have a 75% mid stenosis which I elected to treat medically.  His last Myoview performed July 2008 showed mild inferior basilar ischemia probably related to his collateral insufficiency.  He denies chest pain.

## 2019-03-06 NOTE — Patient Instructions (Signed)
Medication Instructions:  Your physician recommends that you continue on your current medications as directed. Please refer to the Current Medication list given to you today.  If you need a refill on your cardiac medications before your next appointment, please call your pharmacy.   Lab work: NONE  Testing/Procedures: NONE  Follow-Up: At [facility], you and your health needs are our priority.  As part of our continuing mission to provide you with exceptional heart care, we have created designated Provider Care Teams.  These Care Teams include your primary Cardiologist (physician) and Advanced Practice Providers (APPs -  Physician Assistants and Nurse Practitioners) who all work together to provide you with the care you need, when you need it. You may see Dr Berry or one of the following Advanced Practice Providers on your designated Care Team:    Luke Kilroy, PA-C  Callie Goodrich, PA-C  Jesse Cleaver, FNP Your physician wants you to follow-up in: 1 year      

## 2019-03-06 NOTE — Progress Notes (Signed)
03/06/2019 Clifford Sheppard   05-26-1945  [id number]  Primary Physician Clifford Fess, MD Primary Cardiologist: Lorretta Harp MD Clifford Sheppard, [place]  HPI:  Clifford Sheppard is a 73 y.o.  thin and fit appearing, married, Caucasian male, father of three, grandfather to three grandchildren, who I saw  03/22/2018.  He is accompanied by his wife Clifford Sheppard today.. I catheterized him in May 1998 revealing a total RCA of left to right collaterals in the circumflex. The circumflex did have a 75% mid stenosis. I elected to treat him medically at that time. 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   Since I saw him a year ago he has been diagnosed with chronic subdural hematomas followed by Dr. Leonie Sheppard and Dr. Sherley Sheppard.  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  He is also been placed on CPAP for sleep apnea.  He complains of some shortness of breath especially when using his CPAP machine and with lying in a recumbent position but denies dyspnea or chest pain.   Current Meds  Medication Sig  . atorvastatin (LIPITOR) 80 MG tablet Take 80 mg by mouth daily.  Marland Kitchen ibuprofen (ADVIL) 800 MG tablet 1 tablet Orally Three times a day prn pain for 30 days  . metoprolol tartrate (LOPRESSOR) 25 MG tablet TAKE 1/2 TABLET TWICE DAILY  . Multiple Vitamin (MULTIVITAMIN) capsule Take 1 capsule by mouth daily.  . Omega-3 Fatty Acids (FISH OIL PO) Take 1,400 mg by mouth daily.  Marland Kitchen omeprazole (PRILOSEC) 20 MG capsule Take 20 mg by mouth daily as needed (for acid reflux).  . REDNESS RELIEVER EYE DROPS 0.05 % ophthalmic solution Place 2 drops into both eyes as needed (for dry or red eyes).   . sodium chloride (OCEAN) 0.65 % nasal spray Place 2-3 sprays into both nostrils as needed for congestion.      No Known Allergies  Social History   Socioeconomic History  . Marital status: Married    Spouse name: Not on file  . Number of  children: Not on file  . Years of education: Not on file  . Highest education level: Not on file  Occupational History  . Not on file  Social Needs  . Financial resource strain: Not on file  . Food insecurity    Worry: Not on file    Inability: Not on file  . Transportation needs    Medical: Not on file    Non-medical: Not on file  Tobacco Use  . Smoking status: Never Smoker  . Smokeless tobacco: Never Used  Substance and Sexual Activity  . Alcohol use: Yes    Comment: OCC   . Drug use: No  . Sexual activity: Not on file  Lifestyle  . Physical activity    Days per week: Not on file    Minutes per session: Not on file  . Stress: Not on file  Relationships  . Social Herbalist on phone: Not on file    Gets together: Not on file    Attends religious service: Not on file    Active member of club or organization: Not on file    Attends meetings of clubs or organizations: Not on file    Relationship status: Not on file  . Intimate partner violence    Fear of current or ex partner: Not on file    Emotionally abused: Not on file  Physically abused: Not on file    Forced sexual activity: Not on file  Other Topics Concern  . Not on file  Social History Narrative  . Not on file     Review of Systems: General: negative for chills, fever, night sweats or weight changes.  Cardiovascular: negative for chest pain, dyspnea on exertion, edema, orthopnea, palpitations, paroxysmal nocturnal dyspnea or shortness of breath Dermatological: negative for rash Respiratory: negative for cough or wheezing Urologic: negative for hematuria Abdominal: negative for nausea, vomiting, diarrhea, bright red blood per rectum, melena, or hematemesis Neurologic: negative for visual changes, syncope, or dizziness All other systems reviewed and are otherwise negative except as noted above.    Blood pressure 118/82, pulse 64, temperature (!) 96.8 F (36 C), height 5\' 6"  (1.676 m), weight  186 lb (84.4 kg).  General appearance: alert and no distress Neck: no adenopathy, no carotid bruit, no JVD, supple, symmetrical, trachea midline and thyroid not enlarged, symmetric, no tenderness/mass/nodules Lungs: clear to auscultation bilaterally Heart: regular rate and rhythm, S1, S2 normal, no murmur, click, rub or gallop Extremities: extremities normal, atraumatic, no cyanosis or edema Pulses: 2+ and symmetric Skin: Skin color, texture, turgor normal. No rashes or lesions Neurologic: Alert and oriented X 3, normal strength and tone. Normal symmetric reflexes. Normal coordination and gait  EKG not performed today  ASSESSMENT AND PLAN:   Coronary artery disease History of CAD status post cardiac catheterization by myself May 1998 revealing a totally occluded RCA with left-to-right collaterals via the circumflex coronary artery.  Circumflex did have a 75% mid stenosis which I elected to treat medically.  His last Myoview performed July 2008 showed mild inferior basilar ischemia probably related to his collateral insufficiency.  He denies chest pain.  HLD (hyperlipidemia) History of hyperlipidemia on statin therapy with lipid profile performed 02/15/2019 revealing total cholesterol of 169, LDL of 85 and HDL 33  Obstructive sleep apnea History of obstructive sleep apnea on CPAP which he does not feel that he benefits from.      Lorretta Harp MD FACP,FACC,FAHA, [facility] 03/06/2019 2:25 PM

## 2019-03-07 NOTE — Telephone Encounter (Signed)
Pt's appt was yesterday .Adonis Housekeeper

## 2019-03-13 ENCOUNTER — Other Ambulatory Visit: Payer: Self-pay | Admitting: Neurological Surgery

## 2019-03-13 DIAGNOSIS — S065X9A Traumatic subdural hemorrhage with loss of consciousness of unspecified duration, initial encounter: Secondary | ICD-10-CM

## 2019-03-13 DIAGNOSIS — S065XAA Traumatic subdural hemorrhage with loss of consciousness status unknown, initial encounter: Secondary | ICD-10-CM

## 2019-03-19 DIAGNOSIS — G4733 Obstructive sleep apnea (adult) (pediatric): Secondary | ICD-10-CM | POA: Diagnosis not present

## 2019-03-19 DIAGNOSIS — R0981 Nasal congestion: Secondary | ICD-10-CM | POA: Diagnosis not present

## 2019-03-22 DIAGNOSIS — H2513 Age-related nuclear cataract, bilateral: Secondary | ICD-10-CM | POA: Diagnosis not present

## 2019-03-22 DIAGNOSIS — H52223 Regular astigmatism, bilateral: Secondary | ICD-10-CM | POA: Diagnosis not present

## 2019-03-22 DIAGNOSIS — H25013 Cortical age-related cataract, bilateral: Secondary | ICD-10-CM | POA: Diagnosis not present

## 2019-03-23 DIAGNOSIS — R748 Abnormal levels of other serum enzymes: Secondary | ICD-10-CM | POA: Diagnosis not present

## 2019-03-23 DIAGNOSIS — R0600 Dyspnea, unspecified: Secondary | ICD-10-CM | POA: Diagnosis not present

## 2019-03-26 ENCOUNTER — Ambulatory Visit: Payer: Self-pay | Admitting: Family Medicine

## 2019-04-01 DIAGNOSIS — G4733 Obstructive sleep apnea (adult) (pediatric): Secondary | ICD-10-CM | POA: Diagnosis not present

## 2019-04-02 ENCOUNTER — Ambulatory Visit
Admission: RE | Admit: 2019-04-02 | Discharge: 2019-04-02 | Disposition: A | Discharge status: Home / Self Care | Payer: Medicare HMO | Source: Ambulatory Visit | Attending: Neurological Surgery | Admitting: Neurological Surgery

## 2019-04-02 DIAGNOSIS — R519 Headache, unspecified: Secondary | ICD-10-CM | POA: Diagnosis not present

## 2019-04-02 DIAGNOSIS — S065XAA Traumatic subdural hemorrhage with loss of consciousness status unknown, initial encounter: Secondary | ICD-10-CM

## 2019-04-02 DIAGNOSIS — S065X9A Traumatic subdural hemorrhage with loss of consciousness of unspecified duration, initial encounter: Secondary | ICD-10-CM

## 2019-04-03 ENCOUNTER — Other Ambulatory Visit: Payer: Self-pay | Admitting: Neurological Surgery

## 2019-04-03 DIAGNOSIS — S065X9A Traumatic subdural hemorrhage with loss of consciousness of unspecified duration, initial encounter: Secondary | ICD-10-CM | POA: Diagnosis not present

## 2019-04-03 DIAGNOSIS — Z6828 Body mass index (BMI) 28.0-28.9, adult: Secondary | ICD-10-CM | POA: Diagnosis not present

## 2019-04-03 DIAGNOSIS — S065XAA Traumatic subdural hemorrhage with loss of consciousness status unknown, initial encounter: Secondary | ICD-10-CM

## 2019-04-13 DIAGNOSIS — G4733 Obstructive sleep apnea (adult) (pediatric): Secondary | ICD-10-CM | POA: Diagnosis not present

## 2019-04-16 DIAGNOSIS — L570 Actinic keratosis: Secondary | ICD-10-CM | POA: Diagnosis not present

## 2019-04-16 DIAGNOSIS — X32XXXD Exposure to sunlight, subsequent encounter: Secondary | ICD-10-CM | POA: Diagnosis not present

## 2019-04-16 DIAGNOSIS — D225 Melanocytic nevi of trunk: Secondary | ICD-10-CM | POA: Diagnosis not present

## 2019-04-17 DIAGNOSIS — H2512 Age-related nuclear cataract, left eye: Secondary | ICD-10-CM | POA: Diagnosis not present

## 2019-04-17 DIAGNOSIS — H18413 Arcus senilis, bilateral: Secondary | ICD-10-CM | POA: Diagnosis not present

## 2019-04-17 DIAGNOSIS — H25013 Cortical age-related cataract, bilateral: Secondary | ICD-10-CM | POA: Diagnosis not present

## 2019-04-17 DIAGNOSIS — H2513 Age-related nuclear cataract, bilateral: Secondary | ICD-10-CM | POA: Diagnosis not present

## 2019-04-17 DIAGNOSIS — H25043 Posterior subcapsular polar age-related cataract, bilateral: Secondary | ICD-10-CM | POA: Diagnosis not present

## 2019-04-19 ENCOUNTER — Other Ambulatory Visit: Payer: Self-pay

## 2019-04-19 ENCOUNTER — Ambulatory Visit: Payer: Medicare HMO | Admitting: Adult Health

## 2019-04-19 ENCOUNTER — Encounter: Payer: Self-pay | Admitting: Adult Health

## 2019-04-19 VITALS — BP 139/85 | HR 69 | Temp 97.2°F | Ht 68.0 in | Wt 182.8 lb

## 2019-04-19 DIAGNOSIS — Z9989 Dependence on other enabling machines and devices: Secondary | ICD-10-CM

## 2019-04-19 DIAGNOSIS — G4733 Obstructive sleep apnea (adult) (pediatric): Secondary | ICD-10-CM | POA: Diagnosis not present

## 2019-04-19 NOTE — Progress Notes (Signed)
PATIENT: Clifford Sheppard DOB: 1946-03-05  REASON FOR VISIT: follow up HISTORY FROM: patient  HISTORY OF PRESENT ILLNESS: Today 04/19/19:  Clifford Sheppard is a 74 year old male with a history of obstructive sleep apnea on CPAP.  He returns today for follow-up.  His CPAP download indicates that he use his machine 30 out of 30 days for compliance of 100%.  Each night he uses his machine greater than 4 hours.  On average he uses his machine 6 hours and 13 minutes.  His residual AHI is 14 on 7 to 13 cm of water with an EPR of 3.  His leak in the 95th percentile is 25.3 L/min.  According to this download his central apnea index was 11.6 and obstructive was 1.1.  He returns today for an evaluation.  HISTORY /24/2020: Clifford Sheppard he is a 74 year old Caucasian male who developed new onset daily headaches since January 2020.  He describes the headache starting with initially some congestion of his sinuses and he was saw his primary care physician who treated him with a course of steroids for a week without relief.  The headaches were initially intermittent mostly increased with coughing and sneezing.  Headaches that time is posteriorly involving the back of the head and neck occasionally it is behind the eyes.  The headaches are mostly mild 3-4/10 in severity mostly he can get by without medications occasionally has tried taking Tylenol or ibuprofen with partial relief only.  He was seen by Dr. Domingo [facility]ing [facility] and unfortunately I do not have records from there to review today.  He apparently had a CT scan of the head on 05/10/2018 which was normal.  Is also subsequently had an MRI scan of the brain with and without contrast on 06/28/2018 which showed only mild age-appropriate changes of small vessel disease without acute abnormality.  Patient does complain of snoring at night as well as a daytime sleepiness despite sleeping 7 to 8 hours every night.  He denies being under significant stress.  He  does play golf regularly but does not exercise regularly.  He denies any slurred speech, vertigo, focal extremity weakness, gait or balance difficulties.  There is no prior history of strokes TIAs seizures significant head injury or loss of consciousness.  He does complain of some occasional vision difficulties with his headaches but denies true loss of vision or diminished vision acuity.  He had ESR checked by primary physician which was normal.  He states that he has cataracts in both eyes and plans to see an eye doctor to consider surgery soon.  The patient's wife has noticed that he is having some short-term memory difficulties as well in the last couple of months.  Patient feels he still independent and able to do most activities for himself though he is had to write things down in order to not forget.  There is no family history of Alzheimer's or dementia.  REVIEW OF SYSTEMS: Out of a complete 14 system review of symptoms, the patient complains only of the following symptoms, and all other reviewed systems are negative.  Epworth sleepiness score 8 Fatigue severity score 16  ALLERGIES: No Known Allergies  HOME MEDICATIONS: Outpatient Medications Prior to Visit  Medication Sig Dispense Refill  . atorvastatin (LIPITOR) 80 MG tablet Take 80 mg by mouth daily.    Marland Kitchen ibuprofen (ADVIL) 800 MG tablet 1 tablet Orally Three times a day prn pain for 30 days    . metoprolol tartrate (  LOPRESSOR) 25 MG tablet TAKE 1/2 TABLET TWICE DAILY 90 tablet 2  . Multiple Vitamin (MULTIVITAMIN) capsule Take 1 capsule by mouth daily.    . Omega-3 Fatty Acids (FISH OIL PO) Take 1,400 mg by mouth daily.    Marland Kitchen omeprazole (PRILOSEC) 20 MG capsule Take 20 mg by mouth daily as needed (for acid reflux).    . REDNESS RELIEVER EYE DROPS 0.05 % ophthalmic solution Place 2 drops into both eyes as needed (for dry or red eyes).     . sodium chloride (OCEAN) 0.65 % nasal spray Place 2-3 sprays into both nostrils as needed for  congestion.      No facility-administered medications prior to visit.    PAST MEDICAL HISTORY: Past Medical History:  Diagnosis Date  . Arthritis   . CAD (coronary artery disease)   . GERD (gastroesophageal reflux disease)   . H/O hiatal hernia   . HLD (hyperlipidemia)     PAST SURGICAL HISTORY: Past Surgical History:  Procedure Laterality Date  . 2-D echocardiogram  11/05/2004   ejection fraction greater than 55% normal wall motion. Left atrium mildly dilated. Aortic valve mildly sclerotic.  Marland Kitchen CARDIAC CATHETERIZATION     10-12 YRS AGO   . cardiac stress test  10/24/2006   post-rest ejection fraction 74% no significant wall motion abnormalities noted. Was evidence of mild ischemia in the basal inferior and mid inferior region.  Marland Kitchen KNEE SURGERY     LEFT   ACL   1980S  . REFRACTIVE SURGERY     BILAT   . TOTAL HIP ARTHROPLASTY Right 02/08/2014   Procedure: RIGHT TOTAL HIP ARTHROPLASTY ANTERIOR APPROACH;  Surgeon: Alta Corning, MD;  Location: [place];  Service: Orthopedics;  Laterality: Right;    FAMILY HISTORY: No family history on file.  SOCIAL HISTORY: Social History   Socioeconomic History  . Marital status: Married    Spouse name: Not on file  . Number of children: Not on file  . Years of education: Not on file  . Highest education level: Not on file  Occupational History  . Not on file  Tobacco Use  . Smoking status: Never Smoker  . Smokeless tobacco: Never Used  Substance and Sexual Activity  . Alcohol use: Yes    Comment: OCC   . Drug use: No  . Sexual activity: Not on file  Other Topics Concern  . Not on file  Social History Narrative  . Not on file   Social Determinants of Health   Financial Resource Strain:   . Difficulty of Paying Living Expenses: Not on file  Food Insecurity:   . Worried About Charity fundraiser in the Last Year: Not on file  . Ran Out of Food in the Last Year: Not on file  Transportation Needs:   . Lack of Transportation  (Medical): Not on file  . Lack of Transportation (Non-Medical): Not on file  Physical Activity:   . Days of Exercise per Week: Not on file  . Minutes of Exercise per Session: Not on file  Stress:   . Feeling of Stress : Not on file  Social Connections:   . Frequency of Communication with Friends and Family: Not on file  . Frequency of Social Gatherings with Friends and Family: Not on file  . Attends Religious Services: Not on file  . Active Member of Clubs or Organizations: Not on file  . Attends Archivist Meetings: Not on file  . Marital Status: Not on  file  Intimate Partner Violence:   . Fear of Current or Ex-Partner: Not on file  . Emotionally Abused: Not on file  . Physically Abused: Not on file  . Sexually Abused: Not on file      PHYSICAL EXAM  Vitals:   04/19/19 1302  BP: 139/85  Pulse: 69  Temp: (!) 97.2 F (36.2 C)  Weight: 182 lb 12.8 oz (82.9 kg)  Height: '5\' 8"'  (1.727 m)   Body mass index is 27.79 kg/m.  Generalized: Well developed, in no acute distress  Chest: Lungs clear to auscultation bilaterally  Neurological examination  Mentation: Alert oriented to time, place, history taking. Follows all commands speech and language fluent Cranial nerve II-XII: Extraocular movements were full, visual field were full on confrontational test Head turning and shoulder shrug  were normal and symmetric. Motor: The motor testing reveals 5 over 5 strength of all 4 extremities. Good symmetric motor tone is noted throughout.  Sensory: Sensory testing is intact to soft touch on all 4 extremities. No evidence of extinction is noted.  Gait and station: Gait is normal.    DIAGNOSTIC DATA (LABS, IMAGING, TESTING) - I reviewed patient records, labs, notes, testing and imaging myself where available.  Lab Results  Component Value Date   WBC 6.3 12/29/2018   HGB 17.3 (H) 12/29/2018   HCT 48.5 12/29/2018   MCV 92.7 12/29/2018   PLT 152 12/29/2018      Component  Value Date/Time   NA 139 12/29/2018 1853   K 3.7 12/29/2018 1853   CL 103 12/29/2018 1853   CO2 26 12/29/2018 1853   GLUCOSE 113 (H) 12/29/2018 1853   BUN 13 12/29/2018 1853   CREATININE 1.03 12/29/2018 1853   CALCIUM 9.3 12/29/2018 1853   PROT 7.2 01/30/2014 1555   ALBUMIN 4.1 01/30/2014 1555   AST 27 01/30/2014 1555   ALT 27 01/30/2014 1555   ALKPHOS 77 01/30/2014 1555   BILITOT 0.6 01/30/2014 1555   GFRNONAA >60 12/29/2018 1853   GFRAA >60 12/29/2018 1853    Lab Results  Component Value Date   TTSVXBLT90 300 11/27/2018   Lab Results  Component Value Date   TSH 3.210 11/27/2018      ASSESSMENT AND PLAN 74 y.o. year old male  has a past medical history of Arthritis, CAD (coronary artery disease), GERD (gastroesophageal reflux disease), H/O hiatal hernia, and HLD (hyperlipidemia). here with:  1.  Obstructive sleep apnea on CPAP  The patient CPAP download shows excellent compliance however his residual AHI remains elevated.  I will increase his pressure 7 to 15 cm of water.  If this does not lower his AHI he may have to be considered for BiPAP as his download did show more central sleep apneas.  I will pull a download in 1 month to see if the pressure adjustment was beneficial.  He will follow-up in 6 months or sooner if needed.   I spent 15 minutes with the patient. 50% of this time was spent reviewing download   Ward Givens, MSN, NP-C 04/19/2019, 1:12 PM [facility] [address] [place], [place] [address] [phone]

## 2019-04-19 NOTE — Patient Instructions (Signed)
Your Plan:  Continue using CPAP nightly and greater than 4 hours each night. Increase pressure 7-15- DME company will change If your symptoms worsen or you develop new symptoms please let us know.    Thank you for coming to see [place] at [facility]. I hope we have been able to provide you high quality care today.  You may receive a patient satisfaction survey over the next few weeks. We would appreciate your feedback and comments so that we may continue to improve ourselves and the health of our patients.

## 2019-05-02 DIAGNOSIS — G4733 Obstructive sleep apnea (adult) (pediatric): Secondary | ICD-10-CM | POA: Diagnosis not present

## 2019-05-04 DIAGNOSIS — H25812 Combined forms of age-related cataract, left eye: Secondary | ICD-10-CM | POA: Diagnosis not present

## 2019-05-04 DIAGNOSIS — H2511 Age-related nuclear cataract, right eye: Secondary | ICD-10-CM | POA: Diagnosis not present

## 2019-05-04 DIAGNOSIS — H2512 Age-related nuclear cataract, left eye: Secondary | ICD-10-CM | POA: Diagnosis not present

## 2019-05-10 ENCOUNTER — Other Ambulatory Visit: Payer: Self-pay | Admitting: Cardiovascular Disease

## 2019-05-24 DIAGNOSIS — H25811 Combined forms of age-related cataract, right eye: Secondary | ICD-10-CM | POA: Diagnosis not present

## 2019-05-25 DIAGNOSIS — H2511 Age-related nuclear cataract, right eye: Secondary | ICD-10-CM | POA: Diagnosis not present

## 2019-06-02 DIAGNOSIS — G4733 Obstructive sleep apnea (adult) (pediatric): Secondary | ICD-10-CM | POA: Diagnosis not present

## 2019-06-26 DIAGNOSIS — Z0100 Encounter for examination of eyes and vision without abnormal findings: Secondary | ICD-10-CM | POA: Diagnosis not present

## 2019-06-30 DIAGNOSIS — G4733 Obstructive sleep apnea (adult) (pediatric): Secondary | ICD-10-CM | POA: Diagnosis not present

## 2019-07-02 ENCOUNTER — Ambulatory Visit
Admission: RE | Admit: 2019-07-02 | Discharge: 2019-07-02 | Disposition: A | Discharge status: Home / Self Care | Payer: Medicare HMO | Source: Ambulatory Visit | Attending: Neurological Surgery | Admitting: Neurological Surgery

## 2019-07-02 ENCOUNTER — Other Ambulatory Visit: Payer: Self-pay

## 2019-07-02 DIAGNOSIS — I6200 Nontraumatic subdural hemorrhage, unspecified: Secondary | ICD-10-CM | POA: Diagnosis not present

## 2019-07-02 DIAGNOSIS — S065X9A Traumatic subdural hemorrhage with loss of consciousness of unspecified duration, initial encounter: Secondary | ICD-10-CM

## 2019-07-02 DIAGNOSIS — S065XAA Traumatic subdural hemorrhage with loss of consciousness status unknown, initial encounter: Secondary | ICD-10-CM

## 2019-07-10 DIAGNOSIS — S065X9A Traumatic subdural hemorrhage with loss of consciousness of unspecified duration, initial encounter: Secondary | ICD-10-CM | POA: Diagnosis not present

## 2019-07-31 DIAGNOSIS — G4733 Obstructive sleep apnea (adult) (pediatric): Secondary | ICD-10-CM | POA: Diagnosis not present

## 2019-08-06 DIAGNOSIS — S30860A Insect bite (nonvenomous) of lower back and pelvis, initial encounter: Secondary | ICD-10-CM | POA: Diagnosis not present

## 2019-08-26 ENCOUNTER — Encounter: Payer: Self-pay | Admitting: Adult Health

## 2019-08-28 ENCOUNTER — Telehealth: Payer: Self-pay | Admitting: Adult Health

## 2019-08-28 NOTE — Telephone Encounter (Signed)
Pt returned my call. He reports that he has been suffering from nasal congestion that prevents him from using his cpap. He recently started a nasal spray that helps with his congestion. He does plan on restarting his cpap in the near future. He is still paying on it through his DME. I reminded him of his appt in July and recommended that he restart his cpap in light of his osa dx and the upcoming appt in July. Pt verbalized understanding.

## 2019-08-28 NOTE — Telephone Encounter (Signed)
Please call patient and inquire if he plans to use the CPAP machine.  His download shows that he is not using machine in March April or May 2021 previously he only use the machine in November and December 2020

## 2019-08-28 NOTE — Telephone Encounter (Signed)
I called pt to discuss. No answer, left a message asking him to call me back. 

## 2019-08-28 NOTE — Telephone Encounter (Signed)
Can you pull a CPAP download for him

## 2019-08-28 NOTE — Telephone Encounter (Signed)
Therapy report for the past 120 days and 365 days pulled due to non compliance. In your office for review.

## 2019-08-30 DIAGNOSIS — G4733 Obstructive sleep apnea (adult) (pediatric): Secondary | ICD-10-CM | POA: Diagnosis not present

## 2019-09-30 DIAGNOSIS — G4733 Obstructive sleep apnea (adult) (pediatric): Secondary | ICD-10-CM | POA: Diagnosis not present

## 2019-10-22 ENCOUNTER — Ambulatory Visit: Payer: Medicare HMO | Admitting: Adult Health

## 2019-11-22 DIAGNOSIS — M25562 Pain in left knee: Secondary | ICD-10-CM | POA: Diagnosis not present

## 2019-12-06 DIAGNOSIS — M1712 Unilateral primary osteoarthritis, left knee: Secondary | ICD-10-CM | POA: Diagnosis not present

## 2019-12-06 DIAGNOSIS — M25562 Pain in left knee: Secondary | ICD-10-CM | POA: Diagnosis not present

## 2019-12-17 DIAGNOSIS — X32XXXD Exposure to sunlight, subsequent encounter: Secondary | ICD-10-CM | POA: Diagnosis not present

## 2019-12-17 DIAGNOSIS — D225 Melanocytic nevi of trunk: Secondary | ICD-10-CM | POA: Diagnosis not present

## 2019-12-17 DIAGNOSIS — L570 Actinic keratosis: Secondary | ICD-10-CM | POA: Diagnosis not present

## 2019-12-18 ENCOUNTER — Ambulatory Visit: Payer: Medicare HMO | Admitting: Cardiovascular Disease

## 2019-12-18 ENCOUNTER — Other Ambulatory Visit: Payer: Self-pay

## 2019-12-18 ENCOUNTER — Encounter: Payer: Self-pay | Admitting: Cardiovascular Disease

## 2019-12-18 DIAGNOSIS — I2510 Atherosclerotic heart disease of native coronary artery without angina pectoris: Secondary | ICD-10-CM

## 2019-12-18 DIAGNOSIS — E782 Mixed hyperlipidemia: Secondary | ICD-10-CM

## 2019-12-18 NOTE — Progress Notes (Signed)
12/18/2019 Clifford Sheppard   Jul 28, 1945  [id number]  Primary Physician Hulan Fess, MD Primary Cardiologist: Lorretta Harp MD [name], [place]  HPI:  Clifford Sheppard is a 74 y.o.  thin and fit appearing, married, Caucasian male, father of three, grandfather to three grandchildren, who I saw  03/06/2019.  He is accompanied by his wife Clifford Sheppard today.. I catheterized him in May 1998 revealing a total RCA of left to right collaterals in the circumflex. The circumflex did have a 75% mid stenosis. I elected to treat him medically at that time. 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   He  has been diagnosed with chronic subdural hematomas followed by Dr. Leonie Man and Dr. Sherley Bounds.  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  He is also been placed on CPAP for sleep apnea.  He complains of some shortness of breath especially when using his CPAP machine and with lying in a recumbent position but denies dyspnea or chest pain.  Since I saw him a year ago he is remained stable.  He is very active walking [id number] steps a day.  He does play golf as well and has no symptoms.   Current Meds  Medication Sig  . atorvastatin (LIPITOR) 80 MG tablet Take 80 mg by mouth daily.  Marland Kitchen ibuprofen (ADVIL) 800 MG tablet 1 tablet Orally Three times a day prn pain for 30 days  . metoprolol tartrate (LOPRESSOR) 25 MG tablet Take 0.5 tablets (12.5 mg total) by mouth 2 (two) times daily.  . Multiple Vitamin (MULTIVITAMIN) capsule Take 1 capsule by mouth daily.  . Omega-3 Fatty Acids (FISH OIL PO) Take 1,400 mg by mouth daily.  Marland Kitchen omeprazole (PRILOSEC) 20 MG capsule Take 20 mg by mouth daily as needed (for acid reflux).  . REDNESS RELIEVER EYE DROPS 0.05 % ophthalmic solution Place 2 drops into both eyes as needed (for dry or red eyes).   . sodium chloride (OCEAN) 0.65 % nasal spray Place 2-3 sprays into both nostrils as needed for  congestion.      No Known Allergies  Social History   Socioeconomic History  . Marital status: Married    Spouse name: Not on file  . Number of children: Not on file  . Years of education: Not on file  . Highest education level: Not on file  Occupational History  . Not on file  Tobacco Use  . Smoking status: Never Smoker  . Smokeless tobacco: Never Used  Substance and Sexual Activity  . Alcohol use: Yes    Comment: OCC   . Drug use: No  . Sexual activity: Not on file  Other Topics Concern  . Not on file  Social History Narrative  . Not on file   Social Determinants of Health   Financial Resource Strain:   . Difficulty of Paying Living Expenses: Not on file  Food Insecurity:   . Worried About Charity fundraiser in the Last Year: Not on file  . Ran Out of Food in the Last Year: Not on file  Transportation Needs:   . Lack of Transportation (Medical): Not on file  . Lack of Transportation (Non-Medical): Not on file  Physical Activity:   . Days of Exercise per Week: Not on file  . Minutes of Exercise per Session: Not on file  Stress:   . Feeling of Stress : Not on file  Social Connections:   .  Frequency of Communication with Friends and Family: Not on file  . Frequency of Social Gatherings with Friends and Family: Not on file  . Attends Religious Services: Not on file  . Active Member of Clubs or Organizations: Not on file  . Attends Archivist Meetings: Not on file  . Marital Status: Not on file  Intimate Partner Violence:   . Fear of Current or Ex-Partner: Not on file  . Emotionally Abused: Not on file  . Physically Abused: Not on file  . Sexually Abused: Not on file     Review of Systems: General: negative for chills, fever, night sweats or weight changes.  Cardiovascular: negative for chest pain, dyspnea on exertion, edema, orthopnea, palpitations, paroxysmal nocturnal dyspnea or shortness of breath Dermatological: negative for rash Respiratory:  negative for cough or wheezing Urologic: negative for hematuria Abdominal: negative for nausea, vomiting, diarrhea, bright red blood per rectum, melena, or hematemesis Neurologic: negative for visual changes, syncope, or dizziness All other systems reviewed and are otherwise negative except as noted above.    Blood pressure 130/62, pulse 65, height 5\' 8"  (1.727 m), weight 181 lb 3.2 oz (82.2 kg), SpO2 96 %.  General appearance: alert and no distress Neck: no adenopathy, no carotid bruit, no JVD, supple, symmetrical, trachea midline and thyroid not enlarged, symmetric, no tenderness/mass/nodules Lungs: clear to auscultation bilaterally Heart: regular rate and rhythm, S1, S2 normal, no murmur, click, rub or gallop Extremities: extremities normal, atraumatic, no cyanosis or edema Pulses: 2+ and symmetric Skin: Skin color, texture, turgor normal. No rashes or lesions Neurologic: Alert and oriented X 3, normal strength and tone. Normal symmetric reflexes. Normal coordination and gait  EKG sinus rhythm at 65 without ST or T wave changes.  I personally reviewed this EKG.  ASSESSMENT AND PLAN:   Coronary artery disease History of CAD status post cardiac catheterization performed by myself May 1998 revealed a total RCA with left-to-right collaterals to the circumflex did have a 75% mid stenosis.  I elected to treat him medically.  He had a Myoview performed in 2008 that showed mild inferobasal ischemia explainable by his anatomy.  He is very active and denies chest pain or shortness of breath.  HLD (hyperlipidemia) History of hyperlipidemia on high-dose statin therapy with lipid profile performed 02/15/2019 revealing total cholesterol 169, LDL of 85 and HDL 33.      Lorretta Harp MD [facility], [facility] 12/18/2019 4:50 PM

## 2019-12-18 NOTE — Assessment & Plan Note (Signed)
History of CAD status post cardiac catheterization performed by myself May 1998 revealed a total RCA with left-to-right collaterals to the circumflex did have a 75% mid stenosis.  I elected to treat him medically.  He had a Myoview performed in 2008 that showed mild inferobasal ischemia explainable by his anatomy.  He is very active and denies chest pain or shortness of breath.

## 2019-12-18 NOTE — Patient Instructions (Signed)

## 2019-12-18 NOTE — Assessment & Plan Note (Signed)
History of hyperlipidemia on high-dose statin therapy with lipid profile performed 02/15/2019 revealing total cholesterol 169, LDL of 85 and HDL 33.

## 2020-01-22 DIAGNOSIS — M1712 Unilateral primary osteoarthritis, left knee: Secondary | ICD-10-CM | POA: Diagnosis not present

## 2020-01-22 DIAGNOSIS — M25571 Pain in right ankle and joints of right foot: Secondary | ICD-10-CM | POA: Diagnosis not present

## 2020-01-22 DIAGNOSIS — M25562 Pain in left knee: Secondary | ICD-10-CM | POA: Diagnosis not present

## 2020-03-01 ENCOUNTER — Other Ambulatory Visit: Payer: Self-pay | Admitting: Cardiovascular Disease

## 2020-03-03 NOTE — Telephone Encounter (Signed)
Rx(s) sent to pharmacy electronically.  

## 2020-03-07 ENCOUNTER — Ambulatory Visit: Payer: Medicare HMO | Admitting: Cardiovascular Disease

## 2020-03-21 DIAGNOSIS — R7301 Impaired fasting glucose: Secondary | ICD-10-CM | POA: Diagnosis not present

## 2020-03-21 DIAGNOSIS — Z125 Encounter for screening for malignant neoplasm of prostate: Secondary | ICD-10-CM | POA: Diagnosis not present

## 2020-03-21 DIAGNOSIS — I10 Essential (primary) hypertension: Secondary | ICD-10-CM | POA: Diagnosis not present

## 2020-03-26 DIAGNOSIS — K219 Gastro-esophageal reflux disease without esophagitis: Secondary | ICD-10-CM | POA: Diagnosis not present

## 2020-03-26 DIAGNOSIS — Z86010 Personal history of colonic polyps: Secondary | ICD-10-CM | POA: Diagnosis not present

## 2020-03-26 DIAGNOSIS — I10 Essential (primary) hypertension: Secondary | ICD-10-CM | POA: Diagnosis not present

## 2020-03-26 DIAGNOSIS — G4733 Obstructive sleep apnea (adult) (pediatric): Secondary | ICD-10-CM | POA: Diagnosis not present

## 2020-03-26 DIAGNOSIS — Z0000 Encounter for general adult medical examination without abnormal findings: Secondary | ICD-10-CM | POA: Diagnosis not present

## 2020-03-26 DIAGNOSIS — I2510 Atherosclerotic heart disease of native coronary artery without angina pectoris: Secondary | ICD-10-CM | POA: Diagnosis not present

## 2020-03-26 DIAGNOSIS — E782 Mixed hyperlipidemia: Secondary | ICD-10-CM | POA: Diagnosis not present

## 2020-04-03 DIAGNOSIS — M1712 Unilateral primary osteoarthritis, left knee: Secondary | ICD-10-CM | POA: Diagnosis not present

## 2020-04-10 DIAGNOSIS — H5201 Hypermetropia, right eye: Secondary | ICD-10-CM | POA: Diagnosis not present

## 2020-04-10 DIAGNOSIS — M1712 Unilateral primary osteoarthritis, left knee: Secondary | ICD-10-CM | POA: Diagnosis not present

## 2020-04-17 DIAGNOSIS — Z471 Aftercare following joint replacement surgery: Secondary | ICD-10-CM | POA: Diagnosis not present

## 2020-04-17 DIAGNOSIS — Z96641 Presence of right artificial hip joint: Secondary | ICD-10-CM | POA: Diagnosis not present

## 2020-04-17 DIAGNOSIS — M25562 Pain in left knee: Secondary | ICD-10-CM | POA: Diagnosis not present

## 2020-04-17 DIAGNOSIS — M1712 Unilateral primary osteoarthritis, left knee: Secondary | ICD-10-CM | POA: Diagnosis not present

## 2020-04-28 DIAGNOSIS — Z471 Aftercare following joint replacement surgery: Secondary | ICD-10-CM | POA: Diagnosis not present

## 2020-04-28 DIAGNOSIS — Z96641 Presence of right artificial hip joint: Secondary | ICD-10-CM | POA: Diagnosis not present

## 2020-04-28 DIAGNOSIS — M1712 Unilateral primary osteoarthritis, left knee: Secondary | ICD-10-CM | POA: Diagnosis not present

## 2020-04-28 DIAGNOSIS — M25562 Pain in left knee: Secondary | ICD-10-CM | POA: Diagnosis not present

## 2020-05-26 DIAGNOSIS — M25562 Pain in left knee: Secondary | ICD-10-CM | POA: Diagnosis not present

## 2020-05-26 DIAGNOSIS — M1712 Unilateral primary osteoarthritis, left knee: Secondary | ICD-10-CM | POA: Diagnosis not present

## 2020-06-13 ENCOUNTER — Telehealth: Payer: Self-pay | Admitting: *Deleted

## 2020-06-13 DIAGNOSIS — E782 Mixed hyperlipidemia: Secondary | ICD-10-CM | POA: Diagnosis not present

## 2020-06-13 NOTE — Telephone Encounter (Signed)
    [facility] Pre-operative Risk Assessment    Primary cardiologist : Dr Gwenlyn Found  Request for surgical clearance:  1. What type of surgery is being performed? Left knee arthroscopy  2. When is this surgery scheduled? 06/30/20   3. What type of clearance is required (medical clearance vs. Pharmacy clearance to hold med vs. Both)? MEDICAL  4. Are there any medications that need to be held prior to surgery and how long?N/A  5. Practice name and name of physician performing surgery? [facility]: Dr Dorna Leitz   6. What is the office phone number? [phone]    7.   What is the office fax number? [facility]  8.   Anesthesia type (None, local, MAC, general) ? CHOICE   Raiford Simmonds 06/13/2020, 6:12 PM  _________________________________________________________________   (provider comments below)

## 2020-06-16 NOTE — Telephone Encounter (Signed)
   Primary Cardiologist: No primary care provider on file.  Chart reviewed and patient contacted today by phone as part of pre-operative protocol coverage. Given past medical history and time since last visit, based on ACC/AHA guidelines, MEHRAN GUDERIAN would be at acceptable risk for the planned procedure without further cardiovascular testing.   OK to hold aspirin 3-5 days pre op if needed.  The patient was advised that if he develops new symptoms prior to surgery to contact our office to arrange for a follow-up visit, and he verbalized understanding.  I will route this recommendation to the requesting party via Epic fax function and remove from pre-op pool.  Please call with questions.  Kerin Ransom, PA-C 06/16/2020, 10:12 AM

## 2020-06-26 DIAGNOSIS — E782 Mixed hyperlipidemia: Secondary | ICD-10-CM | POA: Diagnosis not present

## 2020-07-01 ENCOUNTER — Other Ambulatory Visit: Payer: Self-pay | Admitting: Orthopedic Surgery

## 2020-07-01 ENCOUNTER — Other Ambulatory Visit: Payer: Self-pay

## 2020-07-01 ENCOUNTER — Encounter (HOSPITAL_BASED_OUTPATIENT_CLINIC_OR_DEPARTMENT_OTHER): Payer: Self-pay | Admitting: Orthopedic Surgery

## 2020-07-01 NOTE — Telephone Encounter (Signed)
Pt called in and would like to know what is instructions are regarding these to med prior to surgery?    Atorvastatin  Metoprolol   Best number [phone]

## 2020-07-04 ENCOUNTER — Other Ambulatory Visit (HOSPITAL_COMMUNITY): Payer: Medicare HMO

## 2020-07-04 NOTE — Progress Notes (Signed)
Text sent to patient reminding of need for covid test before scheduled procedure Monday.  Testing address and hours included.

## 2020-07-07 ENCOUNTER — Ambulatory Visit (HOSPITAL_BASED_OUTPATIENT_CLINIC_OR_DEPARTMENT_OTHER): Admission: RE | Admit: 2020-07-07 | Payer: Medicare HMO | Source: Home / Self Care | Admitting: Orthopedic Surgery

## 2020-07-07 HISTORY — DX: Sleep apnea, unspecified: G47.30

## 2020-07-07 SURGERY — ARTHROSCOPY, KNEE
Anesthesia: Choice | Site: Knee | Laterality: Left

## 2020-08-01 ENCOUNTER — Other Ambulatory Visit: Payer: Self-pay | Admitting: Orthopedic Surgery

## 2020-08-13 DIAGNOSIS — R059 Cough, unspecified: Secondary | ICD-10-CM | POA: Diagnosis not present

## 2020-09-17 ENCOUNTER — Encounter (HOSPITAL_BASED_OUTPATIENT_CLINIC_OR_DEPARTMENT_OTHER): Payer: Self-pay | Admitting: Orthopedic Surgery

## 2020-09-17 ENCOUNTER — Other Ambulatory Visit: Payer: Self-pay

## 2020-09-29 DIAGNOSIS — E782 Mixed hyperlipidemia: Secondary | ICD-10-CM | POA: Diagnosis not present

## 2020-09-30 DIAGNOSIS — H26491 Other secondary cataract, right eye: Secondary | ICD-10-CM | POA: Diagnosis not present

## 2020-09-30 DIAGNOSIS — H18413 Arcus senilis, bilateral: Secondary | ICD-10-CM | POA: Diagnosis not present

## 2020-09-30 DIAGNOSIS — H40013 Open angle with borderline findings, low risk, bilateral: Secondary | ICD-10-CM | POA: Diagnosis not present

## 2020-09-30 DIAGNOSIS — Z961 Presence of intraocular lens: Secondary | ICD-10-CM | POA: Diagnosis not present

## 2020-09-30 DIAGNOSIS — H26493 Other secondary cataract, bilateral: Secondary | ICD-10-CM | POA: Diagnosis not present

## 2020-10-02 NOTE — Anesthesia Preprocedure Evaluation (Addendum)
Anesthesia Evaluation  Patient identified by MRN, date of birth, ID band Patient awake    Reviewed: Allergy & Precautions, NPO status , Patient's Chart, lab work & pertinent test results  Airway Mallampati: II  TM Distance: >3 FB     Dental   Pulmonary sleep apnea ,    breath sounds clear to auscultation       Cardiovascular + CAD   Rhythm:Regular Rate:Normal     Neuro/Psych    GI/Hepatic Neg liver ROS, hiatal hernia, GERD  ,  Endo/Other    Renal/GU negative Renal ROS     Musculoskeletal   Abdominal   Peds  Hematology negative hematology ROS (+)   Anesthesia Other Findings   Reproductive/Obstetrics                            Anesthesia Physical Anesthesia Plan  ASA: 3  Anesthesia Plan: General   Post-op Pain Management:    Induction: Intravenous  PONV Risk Score and Plan: 2 and Ondansetron, Dexamethasone and Midazolam  Airway Management Planned:   Additional Equipment:   Intra-op Plan:   Post-operative Plan: Extubation in OR  Informed Consent: I have reviewed the patients History and Physical, chart, labs and discussed the procedure including the risks, benefits and alternatives for the proposed anesthesia with the patient or authorized representative who has indicated his/her understanding and acceptance.     Dental advisory given  Plan Discussed with: CRNA and Anesthesiologist  Anesthesia Plan Comments:        Anesthesia Quick Evaluation

## 2020-10-03 ENCOUNTER — Encounter (HOSPITAL_BASED_OUTPATIENT_CLINIC_OR_DEPARTMENT_OTHER)
Admission: RE | Disposition: A | Discharge status: Home / Self Care | Payer: Self-pay | Source: Home / Self Care | Attending: Orthopedic Surgery

## 2020-10-03 ENCOUNTER — Ambulatory Visit (HOSPITAL_BASED_OUTPATIENT_CLINIC_OR_DEPARTMENT_OTHER): Payer: Medicare HMO | Admitting: Anesthesiology

## 2020-10-03 ENCOUNTER — Ambulatory Visit (HOSPITAL_BASED_OUTPATIENT_CLINIC_OR_DEPARTMENT_OTHER)
Admission: RE | Admit: 2020-10-03 | Discharge: 2020-10-03 | Disposition: A | Discharge status: Home / Self Care | Payer: Medicare HMO | Attending: Orthopedic Surgery | Admitting: Orthopedic Surgery

## 2020-10-03 ENCOUNTER — Encounter (HOSPITAL_BASED_OUTPATIENT_CLINIC_OR_DEPARTMENT_OTHER): Payer: Self-pay | Admitting: Orthopedic Surgery

## 2020-10-03 ENCOUNTER — Other Ambulatory Visit: Payer: Self-pay

## 2020-10-03 DIAGNOSIS — Z79899 Other long term (current) drug therapy: Secondary | ICD-10-CM | POA: Insufficient documentation

## 2020-10-03 DIAGNOSIS — M1712 Unilateral primary osteoarthritis, left knee: Secondary | ICD-10-CM | POA: Insufficient documentation

## 2020-10-03 DIAGNOSIS — M2342 Loose body in knee, left knee: Secondary | ICD-10-CM | POA: Diagnosis present

## 2020-10-03 DIAGNOSIS — Z7982 Long term (current) use of aspirin: Secondary | ICD-10-CM | POA: Diagnosis not present

## 2020-10-03 DIAGNOSIS — S83282A Other tear of lateral meniscus, current injury, left knee, initial encounter: Secondary | ICD-10-CM | POA: Diagnosis not present

## 2020-10-03 DIAGNOSIS — X58XXXA Exposure to other specified factors, initial encounter: Secondary | ICD-10-CM | POA: Diagnosis not present

## 2020-10-03 DIAGNOSIS — S83242A Other tear of medial meniscus, current injury, left knee, initial encounter: Secondary | ICD-10-CM | POA: Diagnosis not present

## 2020-10-03 DIAGNOSIS — E785 Hyperlipidemia, unspecified: Secondary | ICD-10-CM | POA: Diagnosis not present

## 2020-10-03 DIAGNOSIS — Z96641 Presence of right artificial hip joint: Secondary | ICD-10-CM | POA: Diagnosis not present

## 2020-10-03 DIAGNOSIS — M94262 Chondromalacia, left knee: Secondary | ICD-10-CM | POA: Diagnosis present

## 2020-10-03 HISTORY — PX: KNEE ARTHROSCOPY WITH MEDIAL MENISECTOMY: SHX5651

## 2020-10-03 HISTORY — PX: CHONDROPLASTY: SHX5177

## 2020-10-03 SURGERY — ARTHROSCOPY, KNEE, WITH MEDIAL MENISCECTOMY
Anesthesia: General | Site: Knee | Laterality: Left

## 2020-10-03 MED ORDER — EPINEPHRINE PF 1 MG/ML IJ SOLN
INTRAMUSCULAR | Status: AC
  Filled 2020-10-03: qty 3

## 2020-10-03 MED ORDER — FENTANYL CITRATE (PF) 100 MCG/2ML IJ SOLN
INTRAMUSCULAR | Status: AC
  Filled 2020-10-03: qty 2

## 2020-10-03 MED ORDER — PHENYLEPHRINE HCL (PRESSORS) 10 MG/ML IV SOLN
INTRAVENOUS | Status: DC | PRN
  Administered 2020-10-03: 120 ug via INTRAVENOUS

## 2020-10-03 MED ORDER — PROPOFOL 10 MG/ML IV BOLUS
INTRAVENOUS | Status: AC
  Filled 2020-10-03: qty 40

## 2020-10-03 MED ORDER — DEXAMETHASONE SODIUM PHOSPHATE 10 MG/ML IJ SOLN
INTRAMUSCULAR | Status: AC
  Filled 2020-10-03: qty 1

## 2020-10-03 MED ORDER — BUPIVACAINE HCL (PF) 0.5 % IJ SOLN
INTRAMUSCULAR | Status: DC | PRN
  Administered 2020-10-03: 15 mL via INTRA_ARTICULAR

## 2020-10-03 MED ORDER — FENTANYL CITRATE (PF) 100 MCG/2ML IJ SOLN: 25.0000 ug | INTRAMUSCULAR | Status: DC | PRN

## 2020-10-03 MED ORDER — BUPIVACAINE-EPINEPHRINE (PF) 0.5% -1:200000 IJ SOLN
INTRAMUSCULAR | Status: AC
  Filled 2020-10-03: qty 30

## 2020-10-03 MED ORDER — CEFAZOLIN SODIUM-DEXTROSE 2-3 GM-%(50ML) IV SOLR
INTRAVENOUS | Status: DC | PRN
  Administered 2020-10-03: 2 g via INTRAVENOUS

## 2020-10-03 MED ORDER — ONDANSETRON HCL 4 MG/2ML IJ SOLN
INTRAMUSCULAR | Status: AC
  Filled 2020-10-03: qty 2

## 2020-10-03 MED ORDER — DEXAMETHASONE SODIUM PHOSPHATE 10 MG/ML IJ SOLN
INTRAMUSCULAR | Status: DC | PRN
  Administered 2020-10-03: 10 mg via INTRAVENOUS

## 2020-10-03 MED ORDER — FENTANYL CITRATE (PF) 100 MCG/2ML IJ SOLN
INTRAMUSCULAR | Status: DC | PRN
  Administered 2020-10-03 (×2): 25 ug via INTRAVENOUS
  Administered 2020-10-03: 100 ug via INTRAVENOUS

## 2020-10-03 MED ORDER — LIDOCAINE HCL (CARDIAC) PF 100 MG/5ML IV SOSY
PREFILLED_SYRINGE | INTRAVENOUS | Status: DC | PRN
  Administered 2020-10-03: 100 mg via INTRAVENOUS

## 2020-10-03 MED ORDER — HYDROCODONE-ACETAMINOPHEN 5-325 MG PO TABS: 1.0000 | ORAL_TABLET | Freq: Four times a day (QID) | ORAL | 0 refills | Status: DC | PRN

## 2020-10-03 MED ORDER — LACTATED RINGERS IV SOLN: INTRAVENOUS | Status: DC | PRN

## 2020-10-03 MED ORDER — ONDANSETRON HCL 4 MG/2ML IJ SOLN
INTRAMUSCULAR | Status: DC | PRN
  Administered 2020-10-03: 4 mg via INTRAVENOUS

## 2020-10-03 MED ORDER — BUPIVACAINE HCL (PF) 0.5 % IJ SOLN
INTRAMUSCULAR | Status: AC
  Filled 2020-10-03: qty 30

## 2020-10-03 MED ORDER — CEFAZOLIN SODIUM-DEXTROSE 2-4 GM/100ML-% IV SOLN: 2.0000 g | INTRAVENOUS | Status: DC

## 2020-10-03 MED ORDER — CEFAZOLIN SODIUM-DEXTROSE 2-4 GM/100ML-% IV SOLN
INTRAVENOUS | Status: AC
  Filled 2020-10-03: qty 100

## 2020-10-03 MED ORDER — PROPOFOL 10 MG/ML IV BOLUS
INTRAVENOUS | Status: DC | PRN
  Administered 2020-10-03: 200 mg via INTRAVENOUS

## 2020-10-03 MED ORDER — LACTATED RINGERS IV SOLN: INTRAVENOUS | Status: DC

## 2020-10-03 MED ORDER — SODIUM CHLORIDE 0.9 % IR SOLN
Status: DC | PRN
  Administered 2020-10-03: 3000 mL

## 2020-10-03 SURGICAL SUPPLY — 34 items
BLADE EXCALIBUR 4.0MM X 13CM (MISCELLANEOUS) ×1
BLADE EXCALIBUR 4.0X13 (MISCELLANEOUS) ×2 IMPLANT
BNDG ELASTIC 6X5.8 VLCR STR LF (GAUZE/BANDAGES/DRESSINGS) ×3 IMPLANT
DISSECTOR 4.0MM X 13CM (MISCELLANEOUS) IMPLANT
DRAPE ARTHROSCOPY W/POUCH 90 (DRAPES) ×3 IMPLANT
DRSG EMULSION OIL 3X3 NADH (GAUZE/BANDAGES/DRESSINGS) ×3 IMPLANT
DURAPREP 26ML APPLICATOR (WOUND CARE) ×3 IMPLANT
ELECT MENISCUS 165MM 90D (ELECTRODE) IMPLANT
ELECT REM PT RETURN 9FT ADLT (ELECTROSURGICAL)
ELECTRODE REM PT RTRN 9FT ADLT (ELECTROSURGICAL) IMPLANT
GAUZE SPONGE 4X4 12PLY STRL (GAUZE/BANDAGES/DRESSINGS) ×3 IMPLANT
GLOVE SRG 8 PF TXTR STRL LF DI (GLOVE) ×2 IMPLANT
GLOVE SURG LTX SZ7.5 (GLOVE) ×6 IMPLANT
GLOVE SURG POLYISO LF SZ8 (GLOVE) ×3 IMPLANT
GLOVE SURG UNDER POLY LF SZ7 (GLOVE) ×3 IMPLANT
GLOVE SURG UNDER POLY LF SZ8 (GLOVE) ×6
GOWN STRL REUS W/ TWL LRG LVL3 (GOWN DISPOSABLE) ×1 IMPLANT
GOWN STRL REUS W/ TWL XL LVL3 (GOWN DISPOSABLE) ×1 IMPLANT
GOWN STRL REUS W/TWL LRG LVL3 (GOWN DISPOSABLE) ×3
GOWN STRL REUS W/TWL XL LVL3 (GOWN DISPOSABLE) ×3
HOLDER KNEE FOAM BLUE (MISCELLANEOUS) ×3 IMPLANT
MANIFOLD NEPTUNE II (INSTRUMENTS) ×3 IMPLANT
NDL SAFETY ECLIPSE 18X1.5 (NEEDLE) ×1 IMPLANT
NEEDLE HYPO 18GX1.5 SHARP (NEEDLE) ×3
PACK ARTHROSCOPY DSU (CUSTOM PROCEDURE TRAY) ×3 IMPLANT
PACK BASIN DAY SURGERY FS (CUSTOM PROCEDURE TRAY) ×3 IMPLANT
PAD CAST 4YDX4 CTTN HI CHSV (CAST SUPPLIES) ×1 IMPLANT
PADDING CAST COTTON 4X4 STRL (CAST SUPPLIES) ×3
PENCIL SMOKE EVACUATOR (MISCELLANEOUS) IMPLANT
SUT ETHILON 4 0 PS 2 18 (SUTURE) IMPLANT
SYR 5ML LL (SYRINGE) ×3 IMPLANT
TOWEL GREEN STERILE FF (TOWEL DISPOSABLE) ×3 IMPLANT
TUBING ARTHROSCOPY IRRIG 16FT (MISCELLANEOUS) ×3 IMPLANT
WRAP KNEE MAXI GEL POST OP (GAUZE/BANDAGES/DRESSINGS) ×3 IMPLANT

## 2020-10-03 NOTE — Anesthesia Procedure Notes (Signed)
Procedure Name: LMA Insertion Date/Time: 10/03/2020 7:54 AM Performed by: Verita Lamb, CRNA Pre-anesthesia Checklist: Patient identified, Emergency Drugs available, Suction available and Patient being monitored Patient Re-evaluated:Patient Re-evaluated prior to induction Oxygen Delivery Method: Circle system utilized Preoxygenation: Pre-oxygenation with 100% oxygen Induction Type: IV induction Ventilation: Mask ventilation without difficulty LMA: LMA inserted LMA Size: 4.0 Number of attempts: 1 Airway Equipment and Method: Bite block Placement Confirmation: positive ETCO2, CO2 detector and breath sounds checked- equal and bilateral Tube secured with: Tape Dental Injury: Teeth and Oropharynx as per pre-operative assessment

## 2020-10-03 NOTE — Brief Op Note (Signed)
10/03/2020  10:13 AM  PATIENT:  Clifford Sheppard  75 y.o. 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 TEAR  POST-OPERATIVE DIAGNOSIS:  LEFT KNEE DEGENERATIVE JOINT DISEASE WITH MEDIAL AND LATERAL MENICUS TEAR  PROCEDURE:  Procedure(s): KNEE ARTHROSCOPY WITH PARTIAL MEDIAL AND LATERAL MENISECTOMY (Left) CHONDROPLASTY AND REMOVAL LOOSE BODY (Left)  SURGEON:  Surgeon(s) and Role:    Dorna Leitz, MD - Primary  PHYSICIAN ASSISTANT:   ASSISTANTS: Gaspar Skeeters   ANESTHESIA:   general  EBL:  15 mL   BLOOD ADMINISTERED:none  DRAINS: none   LOCAL MEDICATIONS USED:  MARCAINE     SPECIMEN:  No Specimen  DISPOSITION OF SPECIMEN:  N/A  COUNTS:  YES  TOURNIQUET:  * No tourniquets in log *  DICTATION: .Other Dictation: Dictation Number [id number]  PLAN OF CARE: Admit for overnight observation  PATIENT DISPOSITION:  PACU - hemodynamically stable.   Delay start of Pharmacological VTE agent (>24hrs) due to surgical blood loss or risk of bleeding: no

## 2020-10-03 NOTE — Anesthesia Postprocedure Evaluation (Signed)
Anesthesia Post Note  Patient: Clifford Sheppard  Procedure(s) Performed: KNEE ARTHROSCOPY WITH PARTIAL MEDIAL AND LATERAL MENISECTOMY (Left: Knee) CHONDROPLASTY AND REMOVAL LOOSE BODY (Left: Knee)     Patient location during evaluation: PACU Anesthesia Type: General Level of consciousness: awake Pain management: pain level controlled Vital Signs Assessment: post-procedure vital signs reviewed and stable Respiratory status: spontaneous breathing Cardiovascular status: stable Postop Assessment: no apparent nausea or vomiting Anesthetic complications: no   No notable events documented.  Last Vitals:  Vitals:   10/03/20 0845 10/03/20 0902  BP: 130/79 (!) 159/80  Pulse: 78 77  Resp: 20 16  Temp:  37.1 C  SpO2: 96% 96%    Last Pain:  Vitals:   10/03/20 0902  TempSrc:   PainSc: 0-No pain                 Ceasar Decandia

## 2020-10-03 NOTE — Transfer of Care (Signed)
Immediate Anesthesia Transfer of Care Note  Patient: Clifford Sheppard  Procedure(s) Performed: KNEE ARTHROSCOPY WITH PARTIAL MEDIAL AND LATERAL MENISECTOMY (Left: Knee) CHONDROPLASTY AND REMOVAL LOOSE BODY (Left: Knee)  Patient Location: PACU  Anesthesia Type:General  Level of Consciousness: awake, alert  and oriented  Airway & Oxygen Therapy: Patient Spontanous Breathing and Patient connected to face mask oxygen  Post-op Assessment: Report given to RN and Post -op Vital signs reviewed and stable  Post vital signs: Reviewed and stable  Last Vitals:  Vitals Value Taken Time  BP 141/89 10/03/20 0830  Temp    Pulse 80 10/03/20 0831  Resp 16 10/03/20 0831  SpO2 98 % 10/03/20 0831  Vitals shown include unvalidated device data.  Last Pain:  Vitals:   10/03/20 0643  TempSrc: Oral  PainSc: 1          Complications: No notable events documented.

## 2020-10-03 NOTE — H&P (Signed)
A pre op hand p   Chief Complaint: Left knee pain  HPI: Clifford Sheppard is a 75 y.o. male who presents for evaluation of left knee pain. It has been present for greater than 6 months and is mostly locking and catching and has been worsening. He has failed conservative measures. Pain is rated as moderate.  Past Medical History:  Diagnosis Date   Arthritis    CAD (coronary artery disease)    GERD (gastroesophageal reflux disease)    H/O hiatal hernia    HLD (hyperlipidemia)    Sleep apnea    has cpap does not use regularly   Past Surgical History:  Procedure Laterality Date   2-D echocardiogram  11/05/2004   ejection fraction greater than 55% normal wall motion. Left atrium mildly dilated. Aortic valve mildly sclerotic.   CARDIAC CATHETERIZATION     10-12 YRS AGO    cardiac stress test  10/24/2006   post-rest ejection fraction 74% no significant wall motion abnormalities noted. Was evidence of mild ischemia in the basal inferior and mid inferior region.   KNEE SURGERY     LEFT   ACL   1980S   REFRACTIVE SURGERY     BILAT    TOTAL HIP ARTHROPLASTY Right 02/08/2014   Procedure: RIGHT TOTAL HIP ARTHROPLASTY ANTERIOR APPROACH;  Surgeon: Alta Corning, MD;  Location: [facility];  Service: Orthopedics;  Laterality: Right;   Social History   Socioeconomic History   Marital status: Married    Spouse name: Not on file   Number of children: Not on file   Years of education: Not on file   Highest education level: Not on file  Occupational History   Not on file  Tobacco Use   Smoking status: Never   Smokeless tobacco: Never  Vaping Use   Vaping Use: Never used  Substance and Sexual Activity   Alcohol use: Yes    Comment: OCC    Drug use: No   Sexual activity: Not on file  Other Topics Concern   Not on file  Social History Narrative   Not on file   Social Determinants of Health   Financial Resource Strain: Not on file  Food Insecurity: Not on file  Transportation Needs: Not on  file  Physical Activity: Not on file  Stress: Not on file  Social Connections: Not on file   History reviewed. No pertinent family history. No Known Allergies Prior to Admission medications   Medication Sig Start Date End Date Taking? Authorizing Provider  aspirin EC 81 MG tablet Take 81 mg by mouth daily. Swallow whole.   Yes [provider]  atorvastatin (LIPITOR) 80 MG tablet Take 80 mg by mouth daily. 10/23/12  Yes [provider]  ibuprofen (ADVIL) 800 MG tablet 1 tablet Orally Three times a day prn pain for 30 days 04/19/18  Yes [provider]  metoprolol tartrate (LOPRESSOR) 25 MG tablet TAKE 1/2 TABLET TWICE DAILY 03/03/20  Yes Lorretta Harp, MD  Multiple Vitamin (MULTIVITAMIN) capsule Take 1 capsule by mouth daily.   Yes [provider]  Omega-3 Fatty Acids (FISH OIL PO) Take 1,400 mg by mouth daily.   Yes [provider]  omeprazole (PRILOSEC) 20 MG capsule Take 20 mg by mouth daily as needed (for acid reflux).   Yes [provider]  REDNESS RELIEVER EYE DROPS 0.05 % ophthalmic solution Place 2 drops into both eyes as needed (for dry or red eyes).  09/20/13  Yes [provider]     Positive ROS: None  All other systems have been reviewed and were otherwise negative with the exception of those mentioned in the HPI and as above.  Physical Exam: Vitals:   10/03/20 0643  BP: (!) 170/79  Pulse: 76  Resp: 20  Temp: 99 F (37.2 C)  SpO2: 99%    General: Alert, no acute distress Cardiovascular: No pedal edema Respiratory: No cyanosis, no use of accessory musculature GI: No organomegaly, abdomen is soft and non-tender Skin: No lesions in the area of chief complaint Neurologic: Sensation intact distally Psychiatric: Patient is competent for consent with normal mood and affect Lymphatic: No axillary or cervical lymphadenopathy  MUSCULOSKELETAL: Left knee: Painful range of motion.  Limited range of motion.  No  instability.  Trace effusion.  No erythema or warmth.  Neurovascular intact distally.  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  Predicted outcome is good, although there will be at least a six to nine month expected recovery.  Alta Corning, MD 10/03/2020 7:29 AM

## 2020-10-03 NOTE — Discharge Instructions (Addendum)
POST-OP KNEE ARTHROSCOPY INSTRUCTIONS  Dr. Alain Marion PA-C Remove dressing in 2 days and apply a waterproof Band-Aid over the 2 small incisions to shower. Pain You will be expected to have a moderate amount of pain in the affected knee for approximately two weeks. However, the first two days will be the most severe pain. A prescription has been provided to take as needed for the pain. The pain can be reduced by applying ice packs to the knee for the first 1-2 weeks post surgery. Also, keeping the leg elevated on pillows will help alleviate the pain. If you develop any acute pain or swelling in your calf muscle, please call the doctor.  Activity It is preferred that you stay at bed rest for approximately 24 hours. However, you may go to the bathroom with help. Weight bearing as tolerated. You may begin the knee exercises the day of surgery. Discontinue crutches as the knee pain resolves.  Dressing Keep the dressing dry. If the ace bandage should wrinkle or roll up, this can be rewrapped to prevent ridges in the bandage. You may remove all dressings in 48 hours,  apply bandaids to each wound. You may shower on the 4th day after surgery but no tub bath.  Symptoms to report to your doctor Extreme pain Extreme swelling Temperature above 101 degrees Change in the feeling, color, or movement of your toes Redness, heat, or swelling at your incision  Exercise If is preferred that as soon as possible you try to do a straight leg raise without bending the knee and concentrate on bringing the heel of your foot off the bed up to approximately 45 degrees and hold for the count of 10 seconds. Repeat this at least 10 times three or four times per day. Additional exercises are provided below.  You are encouraged to bend the knee as tolerated.  Follow-Up Call to schedule a follow-up appointment in 5-7 days. Our office # is [phone].  POST-OP EXERCISES  Short Arc Quads  1. Lie on back with  legs straight. Place towel roll under thigh, just above knee. 2. Tighten thigh muscles to straighten knee and lift heel off bed. 3. Hold for slow count of five, then lower. 4. Do three sets of ten    Straight Leg Raises  1. Lie on back with operative leg straight and other leg bent. 2. Keeping operative leg completely straight, slowly lift operative leg so foot is 5 inches off bed. 3. Hold for slow count of five, then lower. 4. Do three sets of ten.    DO BOTH EXERCISES 2 TIMES A DAY  Ankle Pumps  Work/move the operative ankle and foot up and down 10 times every hour while awake.    Post Anesthesia Home Care Instructions  Activity: Get plenty of rest for the remainder of the day. A responsible individual must stay with you for 24 hours following the procedure.  For the next 24 hours, DO NOT: -Drive a car -Paediatric nurse -Drink alcoholic beverages -Take any medication unless instructed by your physician -Make any legal decisions or sign important papers.  Meals: Start with liquid foods such as gelatin or soup. Progress to regular foods as tolerated. Avoid greasy, spicy, heavy foods. If nausea and/or vomiting occur, drink only clear liquids until the nausea and/or vomiting subsides. Call your physician if vomiting continues.  Special Instructions/Symptoms: Your throat may feel dry or sore from the anesthesia or the breathing tube placed in your throat during surgery. If this  causes discomfort, gargle with warm salt water. The discomfort should disappear within 24 hours.  If you had a scopolamine patch placed behind your ear for the management of post- operative nausea and/or vomiting:  1. The medication in the patch is effective for 72 hours, after which it should be removed.  Wrap patch in a tissue and discard in the trash. Wash hands thoroughly with soap and water. 2. You may remove the patch earlier than 72 hours if you experience unpleasant side effects which may  include dry mouth, dizziness or visual disturbances. 3. Avoid touching the patch. Wash your hands with soap and water after contact with the patch.

## 2020-10-03 NOTE — Op Note (Signed)
NAME: Clifford Sheppard, Clifford Sheppard MEDICAL RECORD NO: [id number] ACCOUNT NO: [id number] DATE OF BIRTH: December 23, 1945 FACILITY: [facility] LOCATION: [facility] PHYSICIAN: Alta Corning, MD  Operative Report   DATE OF PROCEDURE: 10/03/2020  He is a 75 year old male on the orthopedic surgery service.  PREOPERATIVE DIAGNOSIS:  Painful left knee with radiographic evidence of narrowing but not bone-on-bone change and the patient was having locking and catching.  POSTOPERATIVE DIAGNOSIS:  Painful left knee with radiographic evidence of narrowing but not bone-on-bone change and the patient was having locking and catching.  PROCEDURE:   1.  Left knee arthroscopy with partial medial and partial lateral meniscectomy. 2.  Chondroplasty of medial, lateral and patellofemoral. 3.  Removal of large osteocartilaginous loose body.  SURGEON:  Dorna Leitz, MD  ASSISTANT:  Gary Fleet, PA-C, who was present for entire case and assisted by retraction of tissues, retraction of the leg and closure to minimize OR time.  BRIEF HISTORY:  The patient is a 75 year old male with a long history with significant complaints of left knee pain.  He had been treated conservatively for a prolonged period of time.  X-ray showed narrowing, but not bone-on-bone change.  He was having  locking and catching and we felt that arthroscopy was a reasonable option to try to control his pain and give him some long-term functional relief.  He was brought to the operating room for left knee arthroscopy.  DESCRIPTION OF PROCEDURE:  The patient was brought to the operating room. After adequate anesthesia was obtained with a general anesthetic, the patient was placed supine on the operating table.  Left leg was prepped and draped in usual sterile fashion.   Following this, routine arthroscopic examination of the knee revealed that there was a remnant of the medial meniscus with some fray.  This was debrided back to a smooth and stable rim.  Attention was  turned to the medial femoral condyle, which had some  articular cartilage injury with some channeling of the medial femoral condyle.  Attention was turned to the ACL, normal.  Attention was turned to the lateral side where there was a mid portion complex lateral meniscal tear as well as a split tear of the  lateral meniscus in the mid body and posteriorly.  Once this was identified, the meniscus was debrided back to a smooth and stable rim of meniscus.   Minimal articular cartilage damage on the lateral femoral condyle was debrided.  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  There was a large area of soft tissue, which was impinging in the lateral joint and as we were taking this down, we encountered a very large  osteocartilaginous loose body.  This had to be removed with a hemostat, and we had to enlarge in this portal to remove the large cartilaginous loose body.  This was taken on the back table and a picture was taken.  Once this was done, the wound was  irrigated.  Knee was irrigated and suctioned dry.  The lateral portal was then closed with interrupted sutures.  The medial portal was closed with just a bandage.  A sterile compressive dressing was applied.  A 20 mL of 0.25% Marcaine was instilled into  the knee for postoperative anesthesia.   SHW D: 10/03/2020 10:18:21 am T: 10/03/2020 11:17:00 am  JOB: [id number]/ [id number]

## 2020-10-07 ENCOUNTER — Encounter (HOSPITAL_BASED_OUTPATIENT_CLINIC_OR_DEPARTMENT_OTHER): Payer: Self-pay | Admitting: Orthopedic Surgery

## 2020-10-07 NOTE — Addendum Note (Signed)
Addendum  created 10/07/20 0714 by Verita Lamb, CRNA   Attestation recorded in [place], [place] deleted, [place] Attestations filed

## 2020-10-07 NOTE — Addendum Note (Signed)
Addendum  created 10/07/20 5732 by Verita Lamb, CRNA   Charge Capture section accepted

## 2020-10-14 ENCOUNTER — Other Ambulatory Visit: Payer: Self-pay | Admitting: Cardiovascular Disease

## 2020-10-14 DIAGNOSIS — M25662 Stiffness of left knee, not elsewhere classified: Secondary | ICD-10-CM | POA: Diagnosis not present

## 2020-10-16 DIAGNOSIS — M25662 Stiffness of left knee, not elsewhere classified: Secondary | ICD-10-CM | POA: Diagnosis not present

## 2020-10-20 DIAGNOSIS — M25662 Stiffness of left knee, not elsewhere classified: Secondary | ICD-10-CM | POA: Diagnosis not present

## 2020-10-21 DIAGNOSIS — H26491 Other secondary cataract, right eye: Secondary | ICD-10-CM | POA: Diagnosis not present

## 2020-10-21 DIAGNOSIS — H26492 Other secondary cataract, left eye: Secondary | ICD-10-CM | POA: Diagnosis not present

## 2020-10-23 DIAGNOSIS — M25662 Stiffness of left knee, not elsewhere classified: Secondary | ICD-10-CM | POA: Diagnosis not present

## 2020-10-27 DIAGNOSIS — M25662 Stiffness of left knee, not elsewhere classified: Secondary | ICD-10-CM | POA: Diagnosis not present

## 2020-10-31 DIAGNOSIS — M25662 Stiffness of left knee, not elsewhere classified: Secondary | ICD-10-CM | POA: Diagnosis not present

## 2020-12-18 DIAGNOSIS — E782 Mixed hyperlipidemia: Secondary | ICD-10-CM | POA: Diagnosis not present

## 2020-12-18 DIAGNOSIS — R7301 Impaired fasting glucose: Secondary | ICD-10-CM | POA: Diagnosis not present

## 2020-12-18 DIAGNOSIS — Z0000 Encounter for general adult medical examination without abnormal findings: Secondary | ICD-10-CM | POA: Diagnosis not present

## 2020-12-18 DIAGNOSIS — I10 Essential (primary) hypertension: Secondary | ICD-10-CM | POA: Diagnosis not present

## 2020-12-18 DIAGNOSIS — N400 Enlarged prostate without lower urinary tract symptoms: Secondary | ICD-10-CM | POA: Diagnosis not present

## 2020-12-18 DIAGNOSIS — Z23 Encounter for immunization: Secondary | ICD-10-CM | POA: Diagnosis not present

## 2021-01-02 ENCOUNTER — Encounter: Payer: Self-pay | Admitting: Cardiovascular Disease

## 2021-01-02 ENCOUNTER — Ambulatory Visit: Payer: Medicare HMO | Admitting: Cardiovascular Disease

## 2021-01-02 ENCOUNTER — Other Ambulatory Visit: Payer: Self-pay

## 2021-01-02 DIAGNOSIS — I2510 Atherosclerotic heart disease of native coronary artery without angina pectoris: Secondary | ICD-10-CM | POA: Diagnosis not present

## 2021-01-02 DIAGNOSIS — E782 Mixed hyperlipidemia: Secondary | ICD-10-CM | POA: Diagnosis not present

## 2021-01-02 MED ORDER — EZETIMIBE 10 MG PO TABS: 10.0000 mg | ORAL_TABLET | Freq: Every day | ORAL | 3 refills | Status: DC

## 2021-01-02 NOTE — Progress Notes (Signed)
01/02/2021 Clifford Sheppard   March 08, 1946  [id number]  [employer], [employer] [facility] [place] Primary Cardiologist: Lorretta Harp MD Garret Reddish, [place], [place]  HPI:  Clifford Sheppard is a 75 y.o.  thin and fit appearing, married, Caucasian male, father of three, grandfather to three grandchildren, who I saw  12/18/2019.  He is accompanied by his wife Clifford Sheppard today.. I catheterized him in May 1998 revealing a total RCA of left to right collaterals in the circumflex. The circumflex did have a 75% mid stenosis. I elected to treat him medically at that time. 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    He  has been diagnosed with chronic subdural hematomas followed by Dr. Leonie Man and Dr. Sherley Bounds.  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  He is also been placed on CPAP for sleep apnea.  He complains of some shortness of breath especially when using his CPAP machine and with lying in a recumbent position but denies dyspnea or chest pain.   Since I saw him a year ago he is remained stable.  He is very active walking [phone] steps a day.  He does play golf as well and has no symptoms.  He and his wife both contracted COVID on 10/06/2020 but had minimal symptoms.   Current Meds  Medication Sig   aspirin EC 81 MG tablet Take 81 mg by mouth daily. Swallow whole.   atorvastatin (LIPITOR) 80 MG tablet Take 80 mg by mouth daily.   metoprolol tartrate (LOPRESSOR) 25 MG tablet TAKE 1/2 TABLET TWICE DAILY   Multiple Vitamin (MULTIVITAMIN) capsule Take 1 capsule by mouth daily.   Omega-3 Fatty Acids (FISH OIL PO) Take 1,200 mg by mouth daily.   omeprazole (PRILOSEC) 20 MG capsule Take 20 mg by mouth daily as needed (for acid reflux).   REDNESS RELIEVER EYE DROPS 0.05 % ophthalmic solution Place 2 drops into both eyes as needed (for dry or red eyes).      No Known Allergies  Social History   Socioeconomic  History   Marital status: Married    Spouse name: Not on file   Number of children: Not on file   Years of education: Not on file   Highest education level: Not on file  Occupational History   Not on file  Tobacco Use   Smoking status: Never   Smokeless tobacco: Never  Vaping Use   Vaping Use: Never used  Substance and Sexual Activity   Alcohol use: Yes    Comment: OCC    Drug use: No   Sexual activity: Not on file  Other Topics Concern   Not on file  Social History Narrative   Not on file   Social Determinants of Health   Financial Resource Strain: Not on file  Food Insecurity: Not on file  Transportation Needs: Not on file  Physical Activity: Not on file  Stress: Not on file  Social Connections: Not on file  Intimate Partner Violence: Not on file     Review of Systems: General: negative for chills, fever, night sweats or weight changes.  Cardiovascular: negative for chest pain, dyspnea on exertion, edema, orthopnea, palpitations, paroxysmal nocturnal dyspnea or shortness of breath Dermatological: negative for rash Respiratory: negative for cough or wheezing Urologic: negative for hematuria Abdominal: negative for nausea, vomiting, diarrhea, bright red blood per rectum, melena, or hematemesis Neurologic: negative for visual changes, syncope, or  dizziness All other systems reviewed and are otherwise negative except as noted above.    Blood pressure (!) 142/70, pulse 60, height 5\' 8"  (1.727 m), weight 170 lb 12.8 oz (77.5 kg), SpO2 98 %.  General appearance: alert and no distress Neck: no adenopathy, no carotid bruit, no JVD, supple, symmetrical, trachea midline, and thyroid not enlarged, symmetric, no tenderness/mass/nodules Lungs: clear to auscultation bilaterally Heart: regular rate and rhythm, S1, S2 normal, no murmur, click, rub or gallop Extremities: extremities normal, atraumatic, no cyanosis or edema Pulses: 2+ and symmetric Skin: Skin color, texture,  turgor normal. No rashes or lesions Neurologic: Grossly normal  EKG sinus rhythm at 60 without ST or T wave changes.  I personally reviewed this EKG.  ASSESSMENT AND PLAN:   Coronary artery disease History of CAD status post cardiac catheterization by myself May 1998 revealing a total RCA with left-to-right collaterals.  The circumflex did have a 75% mid stenosis.  I elected to treat her medically.  Myoview performed July 2008 showed inferobasilar ischemia consistent with his anatomy.  He denies chest pain or shortness of breath.  HLD (hyperlipidemia) History of hyperlipidemia on high-dose atorvastatin with lipid profile performed 12/18/2020 revealing total cholesterol 166, LDL of 93 and HDL 36.  He is not at goal for secondary prevention.  I am going to add Zetia 10 mg a day and we will recheck a lipid liver profile in 3 months.     Lorretta Harp MD FACP,FACC,FAHA, [facility] 01/02/2021 3:38 PM

## 2021-01-02 NOTE — Patient Instructions (Signed)
Medication Instructions:   -Start taking ezetimibe (zetia) 10mg  once daily.  *If you need a refill on your cardiac medications before your next appointment, please call your pharmacy*   Lab Work: Your physician recommends that you return for lab work in: 3 months for fasting lipid/liver profile.  If you have labs (blood work) drawn today and your tests are completely normal, you will receive your results only by: [place] (if you have MyChart) OR A paper copy in the mail If you have any lab test that is abnormal or we need to change your treatment, we will call you to review the results.   Follow-Up: At [facility], you and your health needs are our priority.  As part of our continuing mission to provide you with exceptional heart care, we have created designated Provider Care Teams.  These Care Teams include your primary Cardiologist (physician) and Advanced Practice Providers (APPs -  Physician Assistants and Nurse Practitioners) who all work together to provide you with the care you need, when you need it.  We recommend signing up for the patient portal called "MyChart".  Sign up information is provided on this After Visit Summary.  MyChart is used to connect with patients for Virtual Visits (Telemedicine).  Patients are able to view lab/test results, encounter notes, upcoming appointments, etc.  Non-urgent messages can be sent to your provider as well.   To learn more about what you can do with MyChart, go to [email or web address].    Your next appointment:   12 month(s)  The format for your next appointment:   In Person  Provider:   Quay Burow, MD

## 2021-01-02 NOTE — Assessment & Plan Note (Signed)
History of CAD status post cardiac catheterization by myself May 1998 revealing a total RCA with left-to-right collaterals.  The circumflex did have a 75% mid stenosis.  I elected to treat her medically.  Myoview performed July 2008 showed inferobasilar ischemia consistent with his anatomy.  He denies chest pain or shortness of breath.

## 2021-01-02 NOTE — Assessment & Plan Note (Signed)
History of hyperlipidemia on high-dose atorvastatin with lipid profile performed 12/18/2020 revealing total cholesterol 166, LDL of 93 and HDL 36.  He is not at goal for secondary prevention.  I am going to add Zetia 10 mg a day and we will recheck a lipid liver profile in 3 months.

## 2021-01-05 ENCOUNTER — Telehealth: Payer: Self-pay | Admitting: Cardiovascular Disease

## 2021-01-05 MED ORDER — EZETIMIBE 10 MG PO TABS: 10.0000 mg | ORAL_TABLET | Freq: Every day | ORAL | 3 refills | Status: DC

## 2021-01-05 NOTE — Addendum Note (Signed)
Addended by: Venetia Maxon on: 01/05/2021 10:45 AM   Modules accepted: Orders

## 2021-01-05 NOTE — Telephone Encounter (Signed)
Patient would like a phone call when the refill request has been processed

## 2021-01-05 NOTE — Telephone Encounter (Signed)
Refills has been sent to the pharmacy. 

## 2021-01-05 NOTE — Telephone Encounter (Signed)
 *  STAT* If patient is at the pharmacy, call can be transferred to refill team.   1. Which medications need to be refilled? (please list name of each medication and dose if known)  ezetimibe (ZETIA) 10 MG tablet  2. Which pharmacy/location (including street and city if local pharmacy) is medication to be sent to? [place], [place]  3. Do they need a 30 day or 90 day supply? 90 with refills    The patient would like this medication sent to his mail order pharmacy so the patient can get it for free

## 2021-03-12 DIAGNOSIS — M25562 Pain in left knee: Secondary | ICD-10-CM | POA: Diagnosis not present

## 2021-04-03 DIAGNOSIS — E782 Mixed hyperlipidemia: Secondary | ICD-10-CM | POA: Diagnosis not present

## 2021-04-03 DIAGNOSIS — I2510 Atherosclerotic heart disease of native coronary artery without angina pectoris: Secondary | ICD-10-CM | POA: Diagnosis not present

## 2021-04-03 LAB — LIPID PANEL
Chol/HDL Ratio: 3.7 ratio (ref 0.0–5.0)
Cholesterol, Total: 118 mg/dL (ref 100–199)
HDL: 32 mg/dL — ABNORMAL LOW (ref >39)
LDL Chol Calc (NIH): 63 mg/dL (ref 0–99)
Triglycerides: 128 mg/dL (ref 0–149)
VLDL Cholesterol Cal: 23 mg/dL (ref 5–40)

## 2021-04-03 LAB — HEPATIC FUNCTION PANEL
ALT: 31 IU/L (ref 0–44)
AST: 29 IU/L (ref 0–40)
Albumin: 4.3 g/dL (ref 3.7–4.7)
Alkaline Phosphatase: 77 IU/L (ref 44–121)
Bilirubin Total: 0.8 mg/dL (ref 0.0–1.2)
Bilirubin, Direct: 0.19 mg/dL (ref 0.00–0.40)
Total Protein: 6.3 g/dL (ref 6.0–8.5)

## 2021-04-13 DIAGNOSIS — Z0100 Encounter for examination of eyes and vision without abnormal findings: Secondary | ICD-10-CM | POA: Diagnosis not present

## 2021-06-23 IMAGING — CT CT HEAD W/O CM
3 of 4 series · 15 of 47 positions shown, 18 images · non-contrast
Comparison: 12/29/2018

CLINICAL DATA: Prior subdural hematoma.  Memory loss

EXAM:
CT HEAD WITHOUT CONTRAST
TECHNIQUE: Contiguous axial images were obtained from the base of the skull
through the vertex without intravenous contrast.

[Series 2: head 5.00 hr40 s3 axial ibhc · axial · 0.45mm/px · z∈[-591,-451]mm · 9 of 34 slices shown, 12 images]
[im 3/34  brain]
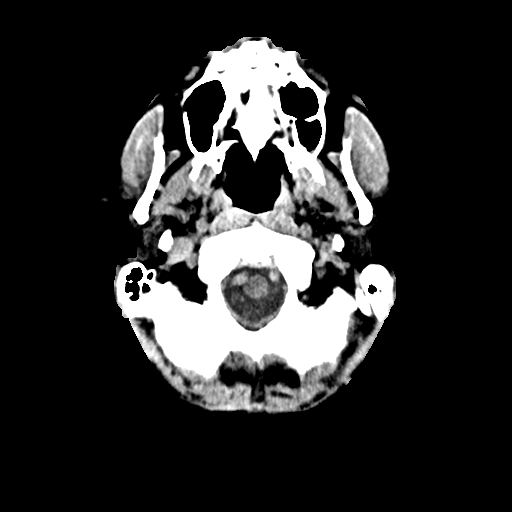
[im 3/34  bone]
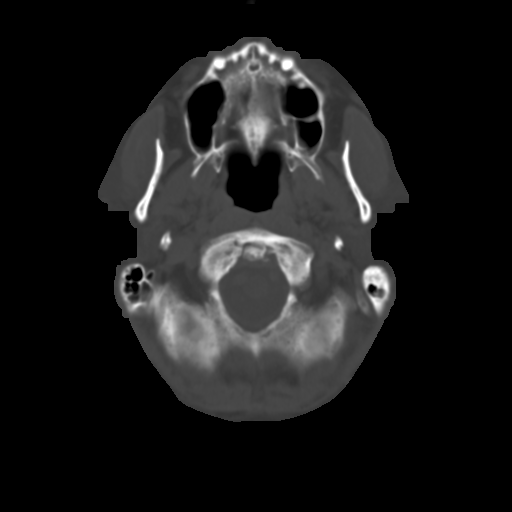
[im 8/34  brain]
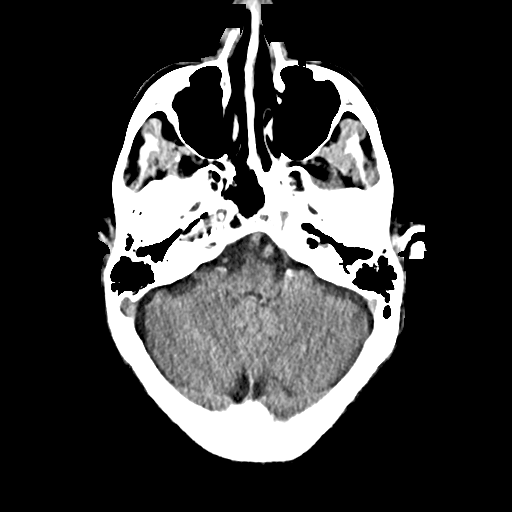
[im 10/34  brain]
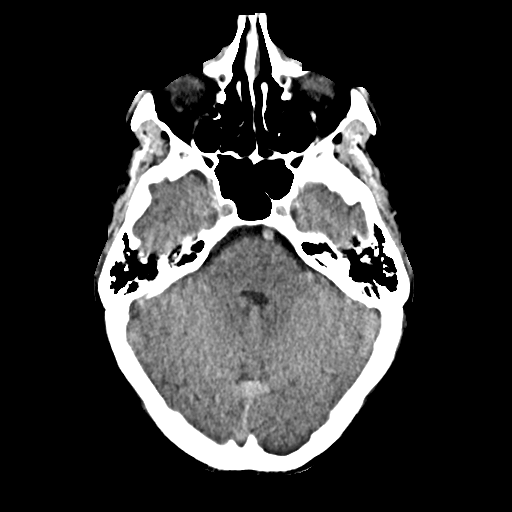
[im 15/34  brain]
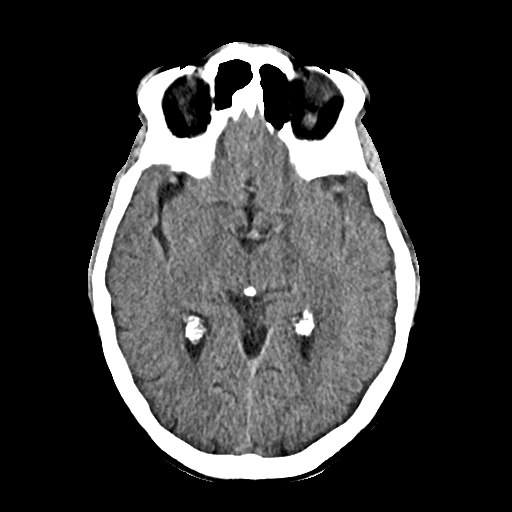
[im 17/34  brain]
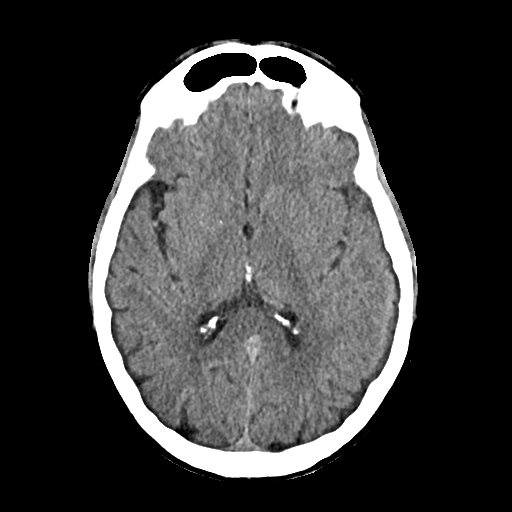
[im 17/34  bone]
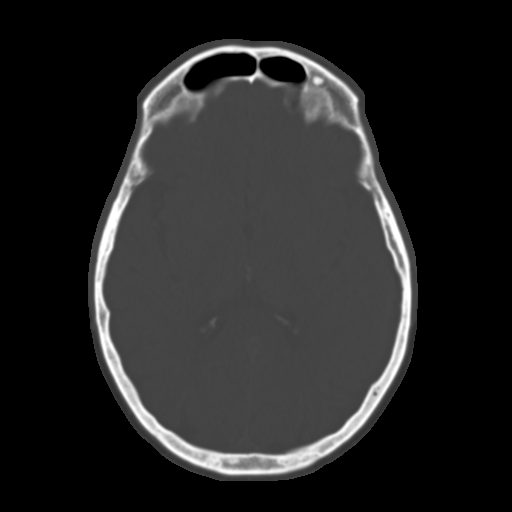
[im 19/34  brain]
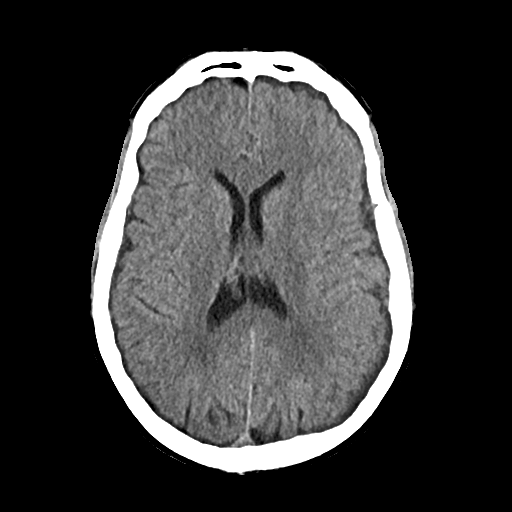
[im 24/34  brain]
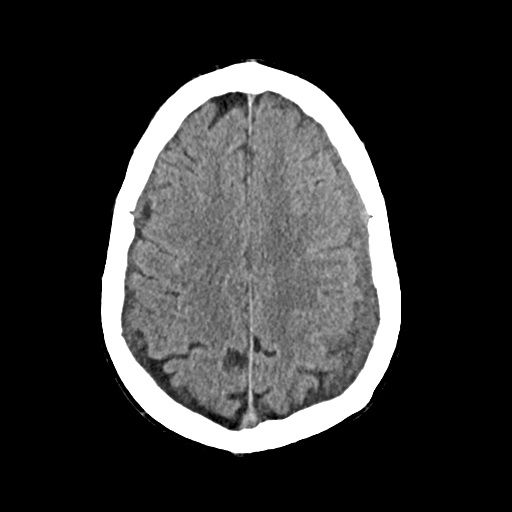
[im 26/34  brain]
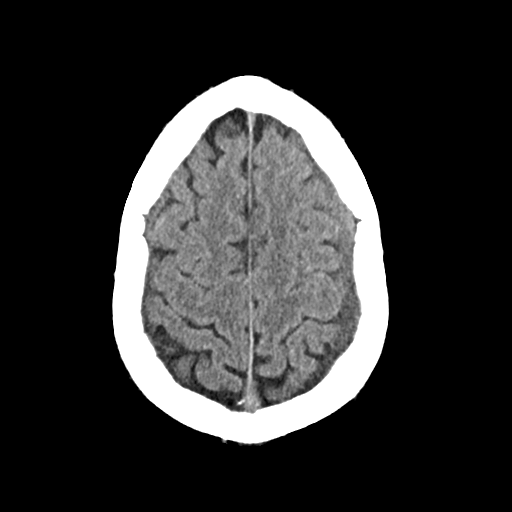
[im 31/34  brain]
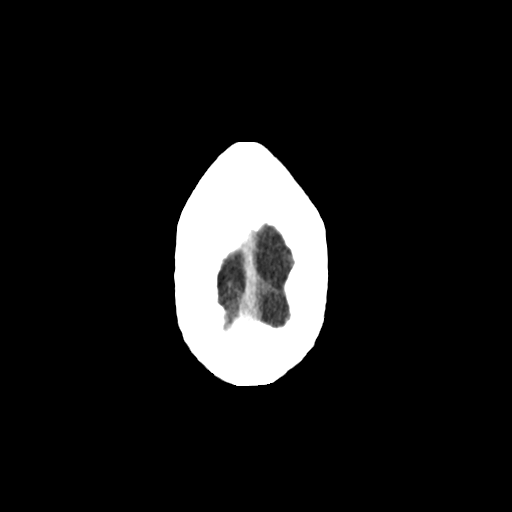
[im 31/34  bone]
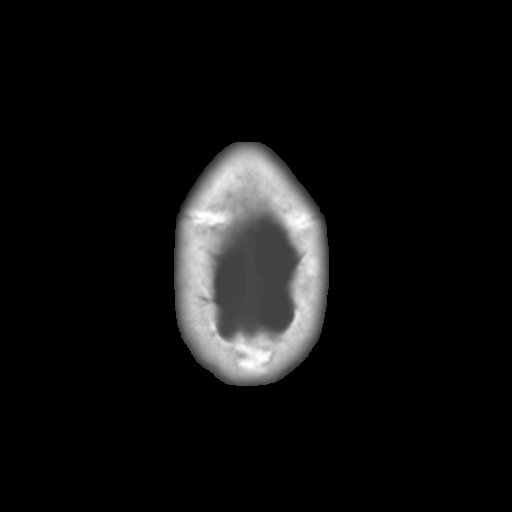

[Series 4: head 3.00 hr40 s3 sag · sagittal · 0.34mm/px · 3 of 61 slices shown]
[im 21/61  brain]
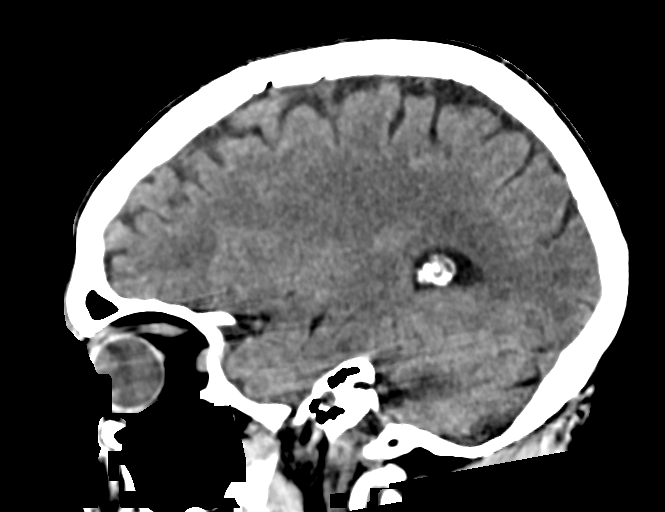
[im 31/61  brain]
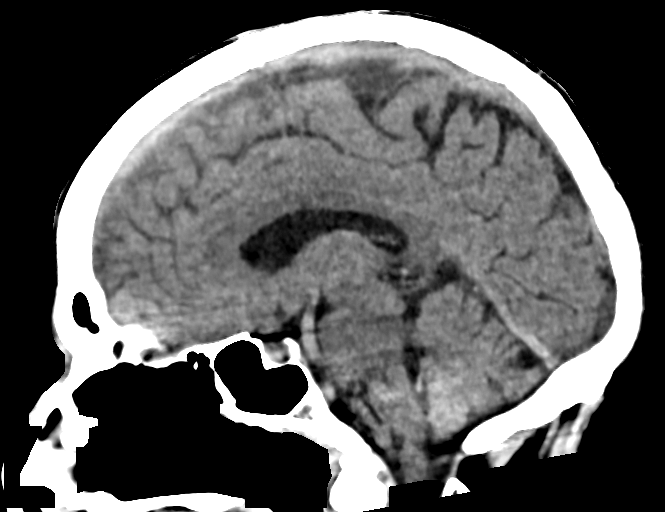
[im 41/61  brain]
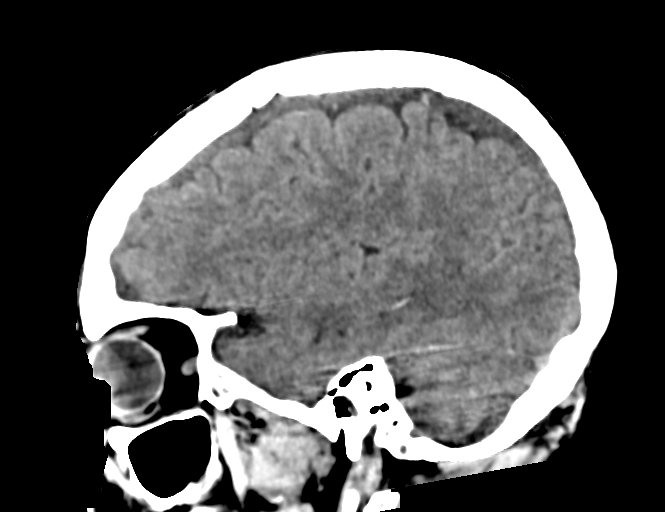

[Series 6: head 3.00 hr40 s3 cor · coronal · 0.34mm/px · 3 of 75 slices shown]
[im 25/75  brain]
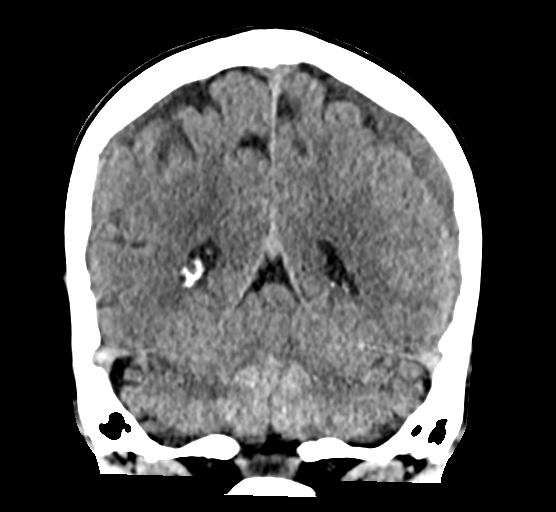
[im 33/75  brain]
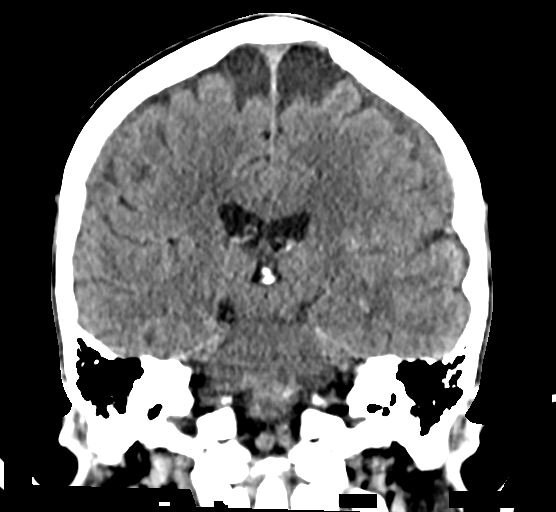
[im 42/75  brain]
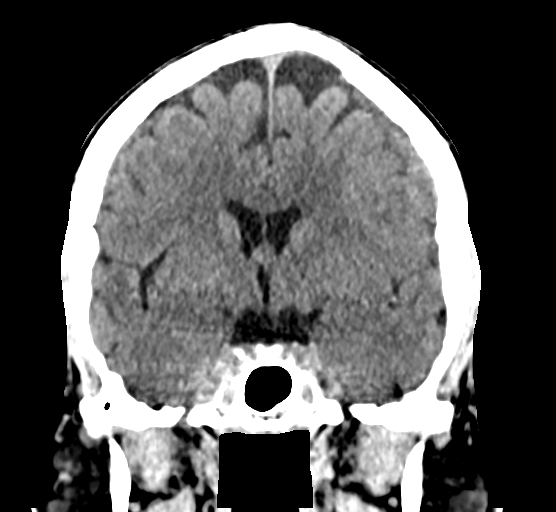

[15 of 47 positions shown; findings below may reference images not displayed]

FINDINGS: Brain: Known subdural collection along the left cerebral convexity
measuring up to 9 mm in thickness. Dimensions and mild mass-effect
are stable from prior brain MRI when remeasured in a similar
fashion. There is also a trace right frontal subdural collection
which is also low-density and measures up to 4 mm in thickness, also
present on prior MRI. No evidence of infarct, acute hemorrhage,
hydrocephalus, or mass.

Vascular: Atherosclerotic calcification

Skull:   No acute or aggressive finding

Sinuses/Orbits: Negative
IMPRESSION: Chronic subdural collection along the bilateral cerebral
convexities, up to 9 mm in thickness on the left and 4 mm on the
right. Cerebral mass effect is mild on the left and absent on the
right. No interval change compared to MRI 12/29/2018.

## 2021-08-05 ENCOUNTER — Other Ambulatory Visit: Payer: Self-pay | Admitting: Cardiovascular Disease

## 2021-10-15 DIAGNOSIS — M25551 Pain in right hip: Secondary | ICD-10-CM | POA: Diagnosis not present

## 2021-10-15 DIAGNOSIS — Z96641 Presence of right artificial hip joint: Secondary | ICD-10-CM | POA: Diagnosis not present

## 2021-10-25 ENCOUNTER — Other Ambulatory Visit: Payer: Self-pay | Admitting: Cardiovascular Disease

## 2021-12-21 DIAGNOSIS — I10 Essential (primary) hypertension: Secondary | ICD-10-CM | POA: Diagnosis not present

## 2021-12-21 DIAGNOSIS — I2510 Atherosclerotic heart disease of native coronary artery without angina pectoris: Secondary | ICD-10-CM | POA: Diagnosis not present

## 2021-12-21 DIAGNOSIS — E782 Mixed hyperlipidemia: Secondary | ICD-10-CM | POA: Diagnosis not present

## 2021-12-21 DIAGNOSIS — R7301 Impaired fasting glucose: Secondary | ICD-10-CM | POA: Diagnosis not present

## 2022-01-07 DIAGNOSIS — Z0000 Encounter for general adult medical examination without abnormal findings: Secondary | ICD-10-CM | POA: Diagnosis not present

## 2022-01-07 DIAGNOSIS — Z8679 Personal history of other diseases of the circulatory system: Secondary | ICD-10-CM | POA: Diagnosis not present

## 2022-01-07 DIAGNOSIS — N400 Enlarged prostate without lower urinary tract symptoms: Secondary | ICD-10-CM | POA: Diagnosis not present

## 2022-01-07 DIAGNOSIS — R7303 Prediabetes: Secondary | ICD-10-CM | POA: Diagnosis not present

## 2022-01-07 DIAGNOSIS — I10 Essential (primary) hypertension: Secondary | ICD-10-CM | POA: Diagnosis not present

## 2022-01-07 DIAGNOSIS — Z86010 Personal history of colonic polyps: Secondary | ICD-10-CM | POA: Diagnosis not present

## 2022-01-07 DIAGNOSIS — G4733 Obstructive sleep apnea (adult) (pediatric): Secondary | ICD-10-CM | POA: Diagnosis not present

## 2022-01-07 DIAGNOSIS — E782 Mixed hyperlipidemia: Secondary | ICD-10-CM | POA: Diagnosis not present

## 2022-01-07 DIAGNOSIS — I2510 Atherosclerotic heart disease of native coronary artery without angina pectoris: Secondary | ICD-10-CM | POA: Diagnosis not present

## 2022-01-15 DIAGNOSIS — M542 Cervicalgia: Secondary | ICD-10-CM | POA: Diagnosis not present

## 2022-01-15 DIAGNOSIS — E782 Mixed hyperlipidemia: Secondary | ICD-10-CM | POA: Diagnosis not present

## 2022-01-15 DIAGNOSIS — T148XXA Other injury of unspecified body region, initial encounter: Secondary | ICD-10-CM | POA: Diagnosis not present

## 2022-01-26 DIAGNOSIS — M542 Cervicalgia: Secondary | ICD-10-CM | POA: Diagnosis not present

## 2022-01-29 DIAGNOSIS — Z86010 Personal history of colonic polyps: Secondary | ICD-10-CM | POA: Diagnosis not present

## 2022-01-29 DIAGNOSIS — K2100 Gastro-esophageal reflux disease with esophagitis, without bleeding: Secondary | ICD-10-CM | POA: Diagnosis not present

## 2022-02-02 DIAGNOSIS — M25462 Effusion, left knee: Secondary | ICD-10-CM | POA: Diagnosis not present

## 2022-02-02 DIAGNOSIS — M1712 Unilateral primary osteoarthritis, left knee: Secondary | ICD-10-CM | POA: Diagnosis not present

## 2022-02-03 ENCOUNTER — Ambulatory Visit: Payer: Medicare HMO | Attending: Cardiovascular Disease | Admitting: Cardiovascular Disease

## 2022-02-03 ENCOUNTER — Encounter: Payer: Self-pay | Admitting: Cardiovascular Disease

## 2022-02-03 VITALS — BP 118/70 | HR 69 | Ht 68.0 in | Wt 177.6 lb

## 2022-02-03 DIAGNOSIS — E782 Mixed hyperlipidemia: Secondary | ICD-10-CM | POA: Diagnosis not present

## 2022-02-03 DIAGNOSIS — I2510 Atherosclerotic heart disease of native coronary artery without angina pectoris: Secondary | ICD-10-CM

## 2022-02-03 NOTE — Assessment & Plan Note (Signed)
History of CAD status post cardiac catheterization performed by myself May 1998 revealing total RCA with left-to-right collaterals and circumflex.  The circumflex had a 75% mid stenosis.  I elected to treat this medically.  Myoview performed July 2008 was remarkable for mild inferior ischemia consistent with his anatomy.  He is completely asymptomatic.

## 2022-02-03 NOTE — Progress Notes (Signed)
02/03/2022 Clifford Sheppard   Jan 21, 1946  [id number]  Primary [employer], [place] Primary Cardiologist: Lorretta Harp MD Garret Reddish, [place], [place]  HPI:  ARIS EVEN is a 76 y.o.  thin and fit appearing, married, Caucasian male, father of three, grandfather to three grandchildren, who I saw 01/02/2021. . I catheterized him in May 1998 revealing a total RCA of left to right collaterals in the circumflex. The circumflex did have a 75% mid stenosis. I elected to treat him medically at that time. 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    He  has been diagnosed with chronic subdural hematomas followed by Dr. Leonie Man and Dr. Sherley Bounds.  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  He is also been placed on CPAP for sleep apnea.    Since I saw him a year ago he is remained stable.  He is very active walking 08-5998 steps a day.  He does play golf as well and has no symptoms.  He and his wife both contracted COVID on 10/06/2020 but had minimal symptoms.  He had left knee arthroplasty by Dr. Berenice Primas but still has significant discomfort.  He denies chest pain or shortness of breath.   Current Meds  Medication Sig   aspirin EC 81 MG tablet Take 81 mg by mouth daily. Swallow whole.   atorvastatin (LIPITOR) 40 MG tablet Take 40 mg by mouth daily.   ezetimibe (ZETIA) 10 MG tablet TAKE 1 TABLET EVERY DAY   ibuprofen (ADVIL) 800 MG tablet    metoprolol tartrate (LOPRESSOR) 25 MG tablet TAKE 1/2 TABLET TWICE DAILY   Multiple Vitamin (MULTIVITAMIN) capsule Take 1 capsule by mouth daily.   Omega-3 Fatty Acids (FISH OIL PO) Take 1,200 mg by mouth daily.   omeprazole (PRILOSEC) 20 MG capsule Take 20 mg by mouth daily as needed (for acid reflux).   REDNESS RELIEVER EYE DROPS 0.05 % ophthalmic solution Place 2 drops into both eyes as needed (for dry or red eyes).    tiZANidine (ZANAFLEX) 4 MG tablet  Take 4 mg by mouth at bedtime as needed.     No Known Allergies  Social History   Socioeconomic History   Marital status: Married    Spouse name: Not on file   Number of children: Not on file   Years of education: Not on file   Highest education level: Not on file  Occupational History   Not on file  Tobacco Use   Smoking status: Never   Smokeless tobacco: Never  Vaping Use   Vaping Use: Never used  Substance and Sexual Activity   Alcohol use: Yes    Comment: OCC    Drug use: No   Sexual activity: Not on file  Other Topics Concern   Not on file  Social History Narrative   Not on file   Social Determinants of Health   Financial Resource Strain: Not on file  Food Insecurity: Not on file  Transportation Needs: Not on file  Physical Activity: Not on file  Stress: Not on file  Social Connections: Not on file  Intimate Partner Violence: Not on file     Review of Systems: General: negative for chills, fever, night sweats or weight changes.  Cardiovascular: negative for chest pain, dyspnea on exertion, edema, orthopnea, palpitations, paroxysmal nocturnal dyspnea or shortness of breath Dermatological: negative for rash Respiratory: negative for cough or wheezing Urologic:  negative for hematuria Abdominal: negative for nausea, vomiting, diarrhea, bright red blood per rectum, melena, or hematemesis Neurologic: negative for visual changes, syncope, or dizziness All other systems reviewed and are otherwise negative except as noted above.    Blood pressure 118/70, pulse 69, height '5\' 8"'$  (1.727 m), weight 177 lb 9.6 oz (80.6 kg), SpO2 97 %.  General appearance: alert and no distress Neck: no adenopathy, no carotid bruit, no JVD, supple, symmetrical, trachea midline, and thyroid not enlarged, symmetric, no tenderness/mass/nodules Lungs: clear to auscultation bilaterally Heart: regular rate and rhythm, S1, S2 normal, no murmur, click, rub or gallop Extremities: extremities  normal, atraumatic, no cyanosis or edema Pulses: 2+ and symmetric Skin: Skin color, texture, turgor normal. No rashes or lesions Neurologic: Grossly normal  EKG sinus rhythm at 69 without ST or T wave changes.  I personally reviewed this EKG.  ASSESSMENT AND PLAN:   Coronary artery disease History of CAD status post cardiac catheterization performed by myself May 1998 revealing total RCA with left-to-right collaterals and circumflex.  The circumflex had a 75% mid stenosis.  I elected to treat this medically.  Myoview performed July 2008 was remarkable for mild inferior ischemia consistent with his anatomy.  He is completely asymptomatic.  HLD (hyperlipidemia) History of hyperlipidemia on statin therapy and Zetia with lipid profile performed 12/21/2021 revealing total cholesterol 133, LDL 72 and HDL 32.     Lorretta Harp MD [facility], [facility] 02/03/2022 9:18 AM

## 2022-02-03 NOTE — Assessment & Plan Note (Signed)
History of hyperlipidemia on statin therapy and Zetia with lipid profile performed 12/21/2021 revealing total cholesterol 133, LDL 72 and HDL 32.

## 2022-02-03 NOTE — Patient Instructions (Signed)
Medication Instructions:  Your physician recommends that you continue on your current medications as directed. Please refer to the Current Medication list given to you today.  *If you need a refill on your cardiac medications before your next appointment, please call your pharmacy*   Follow-Up: At [facility], you and your health needs are our priority.  As part of our continuing mission to provide you with exceptional heart care, we have created designated Provider Care Teams.  These Care Teams include your primary Cardiologist (physician) and Advanced Practice Providers (APPs -  Physician Assistants and Nurse Practitioners) who all work together to provide you with the care you need, when you need it.  We recommend signing up for the patient portal called "MyChart".  Sign up information is provided on this After Visit Summary.  MyChart is used to connect with patients for Virtual Visits (Telemedicine).  Patients are able to view lab/test results, encounter notes, upcoming appointments, etc.  Non-urgent messages can be sent to your provider as well.   To learn more about what you can do with MyChart, go to [email or web address].    Your next appointment:   12 month(s)  The format for your next appointment:   In Person  Provider:   Jonathan Berry, MD   

## 2022-03-05 DIAGNOSIS — L089 Local infection of the skin and subcutaneous tissue, unspecified: Secondary | ICD-10-CM | POA: Diagnosis not present

## 2022-03-11 ENCOUNTER — Other Ambulatory Visit: Payer: Self-pay | Admitting: Cardiovascular Disease

## 2022-03-16 DIAGNOSIS — Z09 Encounter for follow-up examination after completed treatment for conditions other than malignant neoplasm: Secondary | ICD-10-CM | POA: Diagnosis not present

## 2022-03-16 DIAGNOSIS — K649 Unspecified hemorrhoids: Secondary | ICD-10-CM | POA: Diagnosis not present

## 2022-03-16 DIAGNOSIS — Z86010 Personal history of colonic polyps: Secondary | ICD-10-CM | POA: Diagnosis not present

## 2022-03-16 DIAGNOSIS — D122 Benign neoplasm of ascending colon: Secondary | ICD-10-CM | POA: Diagnosis not present

## 2022-03-16 DIAGNOSIS — K5730 Diverticulosis of large intestine without perforation or abscess without bleeding: Secondary | ICD-10-CM | POA: Diagnosis not present

## 2022-03-16 DIAGNOSIS — K635 Polyp of colon: Secondary | ICD-10-CM | POA: Diagnosis not present

## 2022-03-18 DIAGNOSIS — D122 Benign neoplasm of ascending colon: Secondary | ICD-10-CM | POA: Diagnosis not present

## 2022-03-18 DIAGNOSIS — K635 Polyp of colon: Secondary | ICD-10-CM | POA: Diagnosis not present

## 2022-04-13 DIAGNOSIS — H52223 Regular astigmatism, bilateral: Secondary | ICD-10-CM | POA: Diagnosis not present

## 2022-05-21 DIAGNOSIS — R051 Acute cough: Secondary | ICD-10-CM | POA: Diagnosis not present

## 2022-05-21 DIAGNOSIS — B349 Viral infection, unspecified: Secondary | ICD-10-CM | POA: Diagnosis not present

## 2022-05-21 DIAGNOSIS — R509 Fever, unspecified: Secondary | ICD-10-CM | POA: Diagnosis not present

## 2022-05-21 DIAGNOSIS — Z20822 Contact with and (suspected) exposure to covid-19: Secondary | ICD-10-CM | POA: Diagnosis not present

## 2022-07-19 DIAGNOSIS — R7303 Prediabetes: Secondary | ICD-10-CM | POA: Diagnosis not present

## 2022-07-19 DIAGNOSIS — I10 Essential (primary) hypertension: Secondary | ICD-10-CM | POA: Diagnosis not present

## 2022-07-19 DIAGNOSIS — E782 Mixed hyperlipidemia: Secondary | ICD-10-CM | POA: Diagnosis not present

## 2022-07-23 DIAGNOSIS — I2510 Atherosclerotic heart disease of native coronary artery without angina pectoris: Secondary | ICD-10-CM | POA: Diagnosis not present

## 2022-07-23 DIAGNOSIS — R7303 Prediabetes: Secondary | ICD-10-CM | POA: Diagnosis not present

## 2022-07-23 DIAGNOSIS — Z125 Encounter for screening for malignant neoplasm of prostate: Secondary | ICD-10-CM | POA: Diagnosis not present

## 2022-07-23 DIAGNOSIS — Z79899 Other long term (current) drug therapy: Secondary | ICD-10-CM | POA: Diagnosis not present

## 2022-07-23 DIAGNOSIS — K2100 Gastro-esophageal reflux disease with esophagitis, without bleeding: Secondary | ICD-10-CM | POA: Diagnosis not present

## 2022-07-23 DIAGNOSIS — I10 Essential (primary) hypertension: Secondary | ICD-10-CM | POA: Diagnosis not present

## 2022-07-23 DIAGNOSIS — E782 Mixed hyperlipidemia: Secondary | ICD-10-CM | POA: Diagnosis not present

## 2022-07-23 DIAGNOSIS — N400 Enlarged prostate without lower urinary tract symptoms: Secondary | ICD-10-CM | POA: Diagnosis not present

## 2022-10-28 ENCOUNTER — Other Ambulatory Visit: Payer: Self-pay | Admitting: Cardiovascular Disease

## 2022-12-29 ENCOUNTER — Other Ambulatory Visit: Payer: Self-pay | Admitting: Cardiovascular Disease

## 2023-01-13 DIAGNOSIS — N400 Enlarged prostate without lower urinary tract symptoms: Secondary | ICD-10-CM | POA: Diagnosis not present

## 2023-01-13 DIAGNOSIS — Z125 Encounter for screening for malignant neoplasm of prostate: Secondary | ICD-10-CM | POA: Diagnosis not present

## 2023-01-13 DIAGNOSIS — I10 Essential (primary) hypertension: Secondary | ICD-10-CM | POA: Diagnosis not present

## 2023-01-13 DIAGNOSIS — R7303 Prediabetes: Secondary | ICD-10-CM | POA: Diagnosis not present

## 2023-01-13 DIAGNOSIS — E782 Mixed hyperlipidemia: Secondary | ICD-10-CM | POA: Diagnosis not present

## 2023-01-17 DIAGNOSIS — I10 Essential (primary) hypertension: Secondary | ICD-10-CM | POA: Diagnosis not present

## 2023-01-17 DIAGNOSIS — Z0000 Encounter for general adult medical examination without abnormal findings: Secondary | ICD-10-CM | POA: Diagnosis not present

## 2023-01-17 DIAGNOSIS — N400 Enlarged prostate without lower urinary tract symptoms: Secondary | ICD-10-CM | POA: Diagnosis not present

## 2023-01-17 DIAGNOSIS — E782 Mixed hyperlipidemia: Secondary | ICD-10-CM | POA: Diagnosis not present

## 2023-01-17 DIAGNOSIS — I2510 Atherosclerotic heart disease of native coronary artery without angina pectoris: Secondary | ICD-10-CM | POA: Diagnosis not present

## 2023-01-17 DIAGNOSIS — Z23 Encounter for immunization: Secondary | ICD-10-CM | POA: Diagnosis not present

## 2023-01-17 DIAGNOSIS — R7303 Prediabetes: Secondary | ICD-10-CM | POA: Diagnosis not present

## 2023-01-17 DIAGNOSIS — K2100 Gastro-esophageal reflux disease with esophagitis, without bleeding: Secondary | ICD-10-CM | POA: Diagnosis not present

## 2023-01-20 ENCOUNTER — Other Ambulatory Visit: Payer: Self-pay | Admitting: Cardiovascular Disease

## 2023-02-15 ENCOUNTER — Encounter: Payer: Self-pay | Admitting: Cardiovascular Disease

## 2023-02-15 ENCOUNTER — Ambulatory Visit: Payer: Medicare HMO | Attending: Cardiovascular Disease | Admitting: Cardiovascular Disease

## 2023-02-15 VITALS — BP 140/86 | HR 71 | Ht 68.0 in | Wt 180.8 lb

## 2023-02-15 DIAGNOSIS — I2510 Atherosclerotic heart disease of native coronary artery without angina pectoris: Secondary | ICD-10-CM | POA: Diagnosis not present

## 2023-02-15 DIAGNOSIS — E782 Mixed hyperlipidemia: Secondary | ICD-10-CM | POA: Diagnosis not present

## 2023-02-15 DIAGNOSIS — Z136 Encounter for screening for cardiovascular disorders: Secondary | ICD-10-CM

## 2023-02-15 NOTE — Patient Instructions (Signed)

## 2023-02-15 NOTE — Progress Notes (Signed)
02/15/2023 Clifford Sheppard   1945-11-09  [id number]  Primary Physician Clifford Cipro, FNP Primary Cardiologist: Runell Gess MD Clifford Sheppard, [place]  HPI:  Clifford Sheppard is a 77 y.o.    thin and fit appearing, married, Caucasian male, father of three, grandfather to three grandchildren, who I saw 02/03/2022. . I catheterized him in May 1998 revealing a total RCA of left to right collaterals in the circumflex. The circumflex did have a 75% mid stenosis. I elected to treat him medically at that time. 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    He  has been diagnosed with chronic subdural hematomas followed by Dr. Pearlean Brownie and Dr. Marikay Alar.  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  He is also been placed on CPAP for sleep apnea.    Since I saw him a year ago he is remained stable.  He is very active walking 08-5998 steps a day.  He does play golf as well and has no symptoms.    He denies chest pain or shortness of breath   Current Meds  Medication Sig   aspirin EC 81 MG tablet Take 81 mg by mouth daily. Swallow whole.   atorvastatin (LIPITOR) 40 MG tablet Take 40 mg by mouth daily.   ezetimibe (ZETIA) 10 MG tablet TAKE 1 TABLET EVERY DAY   ibuprofen (ADVIL) 800 MG tablet    metoprolol tartrate (LOPRESSOR) 25 MG tablet Take 0.5 tablets (12.5 mg total) by mouth 2 (two) times daily.   Multiple Vitamin (MULTIVITAMIN) capsule Take 1 capsule by mouth daily.   Omega-3 Fatty Acids (FISH OIL PO) Take 1,200 mg by mouth daily.   omeprazole (PRILOSEC) 20 MG capsule Take 20 mg by mouth daily as needed (for acid reflux).   REDNESS RELIEVER EYE DROPS 0.05 % ophthalmic solution Place 2 drops into both eyes as needed (for dry or red eyes).      No Known Allergies  Social History   Socioeconomic History   Marital status: Married    Spouse name: Not on file   Number of children: Not on file   Years of  education: Not on file   Highest education level: Not on file  Occupational History   Not on file  Tobacco Use   Smoking status: Never   Smokeless tobacco: Never  Vaping Use   Vaping status: Never Used  Substance and Sexual Activity   Alcohol use: Yes    Comment: OCC    Drug use: No   Sexual activity: Not on file  Other Topics Concern   Not on file  Social History Narrative   Not on file   Social Determinants of Health   Financial Resource Strain: Not on file  Food Insecurity: Not on file  Transportation Needs: Not on file  Physical Activity: Not on file  Stress: Not on file  Social Connections: Not on file  Intimate Partner Violence: Not on file     Review of Systems: General: negative for chills, fever, night sweats or weight changes.  Cardiovascular: negative for chest pain, dyspnea on exertion, edema, orthopnea, palpitations, paroxysmal nocturnal dyspnea or shortness of breath Dermatological: negative for rash Respiratory: negative for cough or wheezing Urologic: negative for hematuria Abdominal: negative for nausea, vomiting, diarrhea, bright red blood per rectum, melena, or hematemesis Neurologic: negative for visual changes, syncope, or dizziness All other systems reviewed and are otherwise negative except as noted  above.    Blood pressure (!) 140/86, pulse 71, height 5\' 8"  (1.727 m), weight 180 lb 12.8 oz (82 kg), SpO2 97%.  General appearance: alert and no distress Neck: no adenopathy, no carotid bruit, no JVD, supple, symmetrical, trachea midline, and thyroid not enlarged, symmetric, no tenderness/mass/nodules Lungs: clear to auscultation bilaterally Heart: regular rate and rhythm, S1, S2 normal, no murmur, click, rub or gallop Extremities: extremities normal, atraumatic, no cyanosis or edema Pulses: 2+ and symmetric Skin: Skin color, texture, turgor normal. No rashes or lesions Neurologic: Grossly normal  EKG EKG Interpretation Date/Time:  Tuesday  February 15 2023 09:14:12 EST Ventricular Rate:  68 PR Interval:  178 QRS Duration:  90 QT Interval:  418 QTC Calculation: 444 R Axis:   59  Text Interpretation: Sinus rhythm with occasional Premature ventricular complexes Nonspecific T wave abnormality When compared with ECG of 29-Dec-2018 18:45, PREVIOUS ECG IS PRESENT Confirmed by Nanetta Batty [phone]) on 02/15/2023 9:35:57 AM    ASSESSMENT AND PLAN:   Coronary artery disease History of CAD status post cardiac catheterization by myself May 1998 revealing an occluded RCA with left-to-right collaterals and a 75% mid circumflex stenosis.  I elected to treat him medically.  Myoview performed July 2008 showed inferior basal ischemia probably related to collateral insufficiency.  He has been asymptomatic since.  HLD (hyperlipidemia) History of hyperlipidemia on statin therapy and Zetia with lipid profile performed 01/13/2023 revealing a total cholesterol 148, LDL 77 and HDL of 33.     Runell Gess MD FACP,FACC,FAHA, [facility] 02/15/2023 9:50 AM

## 2023-02-15 NOTE — Assessment & Plan Note (Signed)
History of hyperlipidemia on statin therapy and Zetia with lipid profile performed 01/13/2023 revealing a total cholesterol 148, LDL 77 and HDL of 33.

## 2023-02-15 NOTE — Assessment & Plan Note (Signed)
History of CAD status post cardiac catheterization by myself May 1998 revealing an occluded RCA with left-to-right collaterals and a 75% mid circumflex stenosis.  I elected to treat him medically.  Myoview performed July 2008 showed inferior basal ischemia probably related to collateral insufficiency.  He has been asymptomatic since.

## 2023-04-01 ENCOUNTER — Other Ambulatory Visit: Payer: Self-pay | Admitting: Cardiovascular Disease

## 2023-07-04 DIAGNOSIS — N400 Enlarged prostate without lower urinary tract symptoms: Secondary | ICD-10-CM | POA: Diagnosis not present

## 2023-07-04 DIAGNOSIS — K2100 Gastro-esophageal reflux disease with esophagitis, without bleeding: Secondary | ICD-10-CM | POA: Diagnosis not present

## 2023-07-04 DIAGNOSIS — I10 Essential (primary) hypertension: Secondary | ICD-10-CM | POA: Diagnosis not present

## 2023-07-04 DIAGNOSIS — K219 Gastro-esophageal reflux disease without esophagitis: Secondary | ICD-10-CM | POA: Diagnosis not present

## 2023-07-04 DIAGNOSIS — E782 Mixed hyperlipidemia: Secondary | ICD-10-CM | POA: Diagnosis not present

## 2023-07-04 DIAGNOSIS — I2510 Atherosclerotic heart disease of native coronary artery without angina pectoris: Secondary | ICD-10-CM | POA: Diagnosis not present

## 2023-07-04 DIAGNOSIS — R7303 Prediabetes: Secondary | ICD-10-CM | POA: Diagnosis not present

## 2023-09-03 DIAGNOSIS — N400 Enlarged prostate without lower urinary tract symptoms: Secondary | ICD-10-CM | POA: Diagnosis not present

## 2023-09-03 DIAGNOSIS — I2510 Atherosclerotic heart disease of native coronary artery without angina pectoris: Secondary | ICD-10-CM | POA: Diagnosis not present

## 2023-09-03 DIAGNOSIS — I10 Essential (primary) hypertension: Secondary | ICD-10-CM | POA: Diagnosis not present

## 2023-09-03 DIAGNOSIS — E782 Mixed hyperlipidemia: Secondary | ICD-10-CM | POA: Diagnosis not present

## 2023-10-03 DIAGNOSIS — E782 Mixed hyperlipidemia: Secondary | ICD-10-CM | POA: Diagnosis not present

## 2023-10-03 DIAGNOSIS — I2510 Atherosclerotic heart disease of native coronary artery without angina pectoris: Secondary | ICD-10-CM | POA: Diagnosis not present

## 2023-10-03 DIAGNOSIS — N400 Enlarged prostate without lower urinary tract symptoms: Secondary | ICD-10-CM | POA: Diagnosis not present

## 2023-10-03 DIAGNOSIS — I10 Essential (primary) hypertension: Secondary | ICD-10-CM | POA: Diagnosis not present

## 2023-11-03 DIAGNOSIS — N400 Enlarged prostate without lower urinary tract symptoms: Secondary | ICD-10-CM | POA: Diagnosis not present

## 2023-11-03 DIAGNOSIS — E782 Mixed hyperlipidemia: Secondary | ICD-10-CM | POA: Diagnosis not present

## 2023-11-03 DIAGNOSIS — I10 Essential (primary) hypertension: Secondary | ICD-10-CM | POA: Diagnosis not present

## 2023-11-03 DIAGNOSIS — I2510 Atherosclerotic heart disease of native coronary artery without angina pectoris: Secondary | ICD-10-CM | POA: Diagnosis not present

## 2023-12-04 DIAGNOSIS — N400 Enlarged prostate without lower urinary tract symptoms: Secondary | ICD-10-CM | POA: Diagnosis not present

## 2023-12-04 DIAGNOSIS — I2510 Atherosclerotic heart disease of native coronary artery without angina pectoris: Secondary | ICD-10-CM | POA: Diagnosis not present

## 2023-12-04 DIAGNOSIS — I10 Essential (primary) hypertension: Secondary | ICD-10-CM | POA: Diagnosis not present

## 2023-12-04 DIAGNOSIS — E782 Mixed hyperlipidemia: Secondary | ICD-10-CM | POA: Diagnosis not present

## 2024-01-02 ENCOUNTER — Telehealth: Payer: Self-pay | Admitting: Cardiovascular Disease

## 2024-01-02 MED ORDER — EZETIMIBE 10 MG PO TABS: 10.0000 mg | ORAL_TABLET | Freq: Every day | ORAL | 3 refills | Status: DC

## 2024-01-02 MED ORDER — METOPROLOL TARTRATE 25 MG PO TABS: 12.5000 mg | ORAL_TABLET | Freq: Two times a day (BID) | ORAL | 0 refills | Status: DC

## 2024-01-02 NOTE — Telephone Encounter (Signed)
*  STAT* If patient is at the pharmacy, call can be transferred to refill team.   1. Which medications need to be refilled? (please list name of each medication and dose if known) ezetimibe  (ZETIA ) 10 MG tablet    metoprolol  tartrate (LOPRESSOR ) 25 MG tablet    2. Which pharmacy/location (including street and city if local pharmacy) is medication to be sent to?  [id number] - [place], [place] - [address]    3. Do they need a 30 day or 90 day supply? 90

## 2024-01-02 NOTE — Telephone Encounter (Signed)
 RX sent in

## 2024-01-03 DIAGNOSIS — N400 Enlarged prostate without lower urinary tract symptoms: Secondary | ICD-10-CM | POA: Diagnosis not present

## 2024-01-03 DIAGNOSIS — I10 Essential (primary) hypertension: Secondary | ICD-10-CM | POA: Diagnosis not present

## 2024-01-03 DIAGNOSIS — E782 Mixed hyperlipidemia: Secondary | ICD-10-CM | POA: Diagnosis not present

## 2024-01-03 DIAGNOSIS — I2510 Atherosclerotic heart disease of native coronary artery without angina pectoris: Secondary | ICD-10-CM | POA: Diagnosis not present

## 2024-01-16 DIAGNOSIS — E782 Mixed hyperlipidemia: Secondary | ICD-10-CM | POA: Diagnosis not present

## 2024-01-16 DIAGNOSIS — I2510 Atherosclerotic heart disease of native coronary artery without angina pectoris: Secondary | ICD-10-CM | POA: Diagnosis not present

## 2024-01-16 DIAGNOSIS — R7303 Prediabetes: Secondary | ICD-10-CM | POA: Diagnosis not present

## 2024-01-16 DIAGNOSIS — N400 Enlarged prostate without lower urinary tract symptoms: Secondary | ICD-10-CM | POA: Diagnosis not present

## 2024-01-16 DIAGNOSIS — I10 Essential (primary) hypertension: Secondary | ICD-10-CM | POA: Diagnosis not present

## 2024-01-16 DIAGNOSIS — K219 Gastro-esophageal reflux disease without esophagitis: Secondary | ICD-10-CM | POA: Diagnosis not present

## 2024-01-19 DIAGNOSIS — I10 Essential (primary) hypertension: Secondary | ICD-10-CM | POA: Diagnosis not present

## 2024-01-19 DIAGNOSIS — I2510 Atherosclerotic heart disease of native coronary artery without angina pectoris: Secondary | ICD-10-CM | POA: Diagnosis not present

## 2024-01-19 DIAGNOSIS — Z0000 Encounter for general adult medical examination without abnormal findings: Secondary | ICD-10-CM | POA: Diagnosis not present

## 2024-01-19 DIAGNOSIS — K219 Gastro-esophageal reflux disease without esophagitis: Secondary | ICD-10-CM | POA: Diagnosis not present

## 2024-01-19 DIAGNOSIS — E782 Mixed hyperlipidemia: Secondary | ICD-10-CM | POA: Diagnosis not present

## 2024-01-19 DIAGNOSIS — N400 Enlarged prostate without lower urinary tract symptoms: Secondary | ICD-10-CM | POA: Diagnosis not present

## 2024-01-19 DIAGNOSIS — R7303 Prediabetes: Secondary | ICD-10-CM | POA: Diagnosis not present

## 2024-01-19 DIAGNOSIS — Z23 Encounter for immunization: Secondary | ICD-10-CM | POA: Diagnosis not present

## 2024-02-03 DIAGNOSIS — I2510 Atherosclerotic heart disease of native coronary artery without angina pectoris: Secondary | ICD-10-CM | POA: Diagnosis not present

## 2024-02-03 DIAGNOSIS — E782 Mixed hyperlipidemia: Secondary | ICD-10-CM | POA: Diagnosis not present

## 2024-02-03 DIAGNOSIS — N400 Enlarged prostate without lower urinary tract symptoms: Secondary | ICD-10-CM | POA: Diagnosis not present

## 2024-02-03 DIAGNOSIS — I10 Essential (primary) hypertension: Secondary | ICD-10-CM | POA: Diagnosis not present

## 2024-02-13 ENCOUNTER — Ambulatory Visit: Payer: Self-pay | Attending: Cardiovascular Disease | Admitting: Cardiovascular Disease

## 2024-02-13 ENCOUNTER — Encounter: Payer: Self-pay | Admitting: Cardiovascular Disease

## 2024-02-13 VITALS — BP 140/82 | HR 59 | Ht 68.0 in | Wt 181.0 lb

## 2024-02-13 DIAGNOSIS — I10 Essential (primary) hypertension: Secondary | ICD-10-CM | POA: Diagnosis not present

## 2024-02-13 DIAGNOSIS — I2510 Atherosclerotic heart disease of native coronary artery without angina pectoris: Secondary | ICD-10-CM

## 2024-02-13 DIAGNOSIS — E782 Mixed hyperlipidemia: Secondary | ICD-10-CM | POA: Diagnosis not present

## 2024-02-13 MED ORDER — EZETIMIBE 10 MG PO TABS: 10.0000 mg | ORAL_TABLET | Freq: Every day | ORAL | 3 refills | Status: AC

## 2024-02-13 MED ORDER — METOPROLOL TARTRATE 25 MG PO TABS: 12.5000 mg | ORAL_TABLET | Freq: Two times a day (BID) | ORAL | 3 refills | Status: AC

## 2024-02-13 NOTE — Progress Notes (Signed)
 02/13/2024 Clifford Sheppard   Jun 23, 1945  [id number]  Primary Physician Marvene Prentice SAUNDERS, FNP Primary Cardiologist: Dorn JINNY Lesches MD [facility] [place], [place]  HPI:  Clifford Sheppard is a 78 y.o.   thin and fit appearing, married, Caucasian male, father of three, grandfather to three grandchildren, who I saw 02/15/2023.SABRA  He is accompanied by his wife Andres today. I catheterized him in May 1998 revealing a total RCA of left to right collaterals in the circumflex. The circumflex did have a 75% mid stenosis. I elected to treat him medically at that time. 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    He  has been diagnosed with chronic subdural hematomas followed by Dr. Rosemarie and Dr. Alm Molt.  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  He is also been placed on CPAP for sleep apnea.    Since I saw him a year ago he is remained stable.  He is very active walking 08-7998 steps a day.  He does play golf as well and has no symptoms.    He denies chest pain or shortness of breath   Current Meds  Medication Sig   aspirin  EC 81 MG tablet Take 81 mg by mouth daily. Swallow whole.   atorvastatin  (LIPITOR ) 40 MG tablet Take 40 mg by mouth daily.   ezetimibe  (ZETIA ) 10 MG tablet Take 1 tablet (10 mg total) by mouth daily.   ibuprofen (ADVIL) 800 MG tablet    metoprolol  tartrate (LOPRESSOR ) 25 MG tablet Take 0.5 tablets (12.5 mg total) by mouth 2 (two) times daily.   Multiple Vitamin (MULTIVITAMIN) capsule Take 1 capsule by mouth daily.   Omega-3 Fatty Acids (FISH OIL PO) Take 1,200 mg by mouth daily.   omeprazole (PRILOSEC) 20 MG capsule Take 20 mg by mouth daily as needed (for acid reflux).   REDNESS RELIEVER EYE DROPS 0.05 % ophthalmic solution Place 2 drops into both eyes as needed (for dry or red eyes).      No Known Allergies  Social History   Socioeconomic History   Marital status: Married    Spouse name:  Not on file   Number of children: Not on file   Years of education: Not on file   Highest education level: Not on file  Occupational History   Not on file  Tobacco Use   Smoking status: Never   Smokeless tobacco: Never  Vaping Use   Vaping status: Never Used  Substance and Sexual Activity   Alcohol use: Yes    Comment: OCC    Drug use: No   Sexual activity: Not on file  Other Topics Concern   Not on file  Social History Narrative   Not on file   Social Drivers of Health   Financial Resource Strain: Not on file  Food Insecurity: Not on file  Transportation Needs: Not on file  Physical Activity: Not on file  Stress: Not on file  Social Connections: Not on file  Intimate Partner Violence: Not on file     Review of Systems: General: negative for chills, fever, night sweats or weight changes.  Cardiovascular: negative for chest pain, dyspnea on exertion, edema, orthopnea, palpitations, paroxysmal nocturnal dyspnea or shortness of breath Dermatological: negative for rash Respiratory: negative for cough or wheezing Urologic: negative for hematuria Abdominal: negative for nausea, vomiting, diarrhea, bright red blood per rectum, melena, or hematemesis Neurologic: negative for visual changes, syncope, or  dizziness All other systems reviewed and are otherwise negative except as noted above.    Blood pressure (!) 140/82, pulse (!) 59, height 5' 8 (1.727 m), weight 181 lb (82.1 kg), SpO2 97%.  General appearance: alert and no distress Neck: no adenopathy, no carotid bruit, no JVD, supple, symmetrical, trachea midline, and thyroid  not enlarged, symmetric, no tenderness/mass/nodules Lungs: clear to auscultation bilaterally Heart: regular rate and rhythm, S1, S2 normal, no murmur, click, rub or gallop Extremities: extremities normal, atraumatic, no cyanosis or edema Pulses: 2+ and symmetric Skin: Skin color, texture, turgor normal. No rashes or lesions Neurologic: Grossly  normal  EKG EKG Interpretation Date/Time:  Monday February 13 2024 14:12:46 EST Ventricular Rate:  59 PR Interval:  188 QRS Duration:  88 QT Interval:  438 QTC Calculation: 433 R Axis:   29  Text Interpretation: Sinus bradycardia Possible Left atrial enlargement When compared with ECG of 15-Feb-2023 09:14, Premature ventricular complexes are no longer Present Confirmed by Court Carrier [phone]) on 02/13/2024 2:16:56 PM    ASSESSMENT AND PLAN:   Coronary artery disease History of CAD status post cardiac catheterization performed by myself May 1998 revealing an occluded RCA with left-to-right collaterals from the circumflex.  Circumflex did have a 75% stenosis in the midportion.  I elected to treat her medically that time.  He had a Myoview performed July the weight that showed mild inferobasal ischemia probably related to collateral insufficiency.  He is active and completely asymptomatic.  HLD (hyperlipidemia) History of hyperlipidemia on Zetia  and atorvastatin  with lipid profile performed 01/16/2024 revealing total cholesterol 154, LDL 77 HDL of 36.  Essential hypertension History of essential hypertension with blood pressure initially 154/82 following to 180/82 by the end of the visit.  He is on metoprolol .     Carrier DOROTHA Court MD [facility], [facility] 02/13/2024 2:24 PM

## 2024-02-13 NOTE — Assessment & Plan Note (Signed)
 History of CAD status post cardiac catheterization performed by myself May 1998 revealing an occluded RCA with left-to-right collaterals from the circumflex.  Circumflex did have a 75% stenosis in the midportion.  I elected to treat her medically that time.  He had a Myoview performed July the weight that showed mild inferobasal ischemia probably related to collateral insufficiency.  He is active and completely asymptomatic.

## 2024-02-13 NOTE — Assessment & Plan Note (Signed)
 History of essential hypertension with blood pressure initially 154/82 following to 180/82 by the end of the visit.  He is on metoprolol .

## 2024-02-13 NOTE — Patient Instructions (Addendum)
 Medication Instructions:  Your physician recommends that you continue on your current medications as directed. Please refer to the Current Medication list given to you today.  *If you need a refill on your cardiac medications before your next appointment, please call your pharmacy*    Follow-Up: At [facility], you and your health needs are our priority.  As part of our continuing mission to provide you with exceptional heart care, our providers are all part of one team.  This team includes your primary Cardiologist (physician) and Advanced Practice Providers or APPs (Physician Assistants and Nurse Practitioners) who all work together to provide you with the care you need, when you need it.  Your next appointment:   1 year(s)  Provider:   Dorn Lesches, MD

## 2024-02-13 NOTE — Assessment & Plan Note (Signed)
 History of hyperlipidemia on Zetia  and atorvastatin  with lipid profile performed 01/16/2024 revealing total cholesterol 154, LDL 77 HDL of 36.

## 2024-03-04 DIAGNOSIS — E782 Mixed hyperlipidemia: Secondary | ICD-10-CM | POA: Diagnosis not present

## 2024-03-04 DIAGNOSIS — I2510 Atherosclerotic heart disease of native coronary artery without angina pectoris: Secondary | ICD-10-CM | POA: Diagnosis not present

## 2024-03-04 DIAGNOSIS — N400 Enlarged prostate without lower urinary tract symptoms: Secondary | ICD-10-CM | POA: Diagnosis not present

## 2024-03-04 DIAGNOSIS — I10 Essential (primary) hypertension: Secondary | ICD-10-CM | POA: Diagnosis not present
